# Patient Record
Sex: Male | Born: 2000
Health system: Southern US, Community
[De-identification: ages and names within clinical notes are randomized; demographics above are authoritative.]

## PROBLEM LIST (undated history)

## (undated) DIAGNOSIS — T8859XA Other complications of anesthesia, initial encounter: Secondary | ICD-10-CM

## (undated) DIAGNOSIS — D4989 Neoplasm of unspecified behavior of other specified sites: Secondary | ICD-10-CM

## (undated) DIAGNOSIS — Z8709 Personal history of other diseases of the respiratory system: Secondary | ICD-10-CM

## (undated) DIAGNOSIS — T4145XA Adverse effect of unspecified anesthetic, initial encounter: Secondary | ICD-10-CM

## (undated) DIAGNOSIS — M2520 Flail joint, unspecified joint: Secondary | ICD-10-CM

## (undated) DIAGNOSIS — M6289 Other specified disorders of muscle: Secondary | ICD-10-CM

## (undated) DIAGNOSIS — R29898 Other symptoms and signs involving the musculoskeletal system: Secondary | ICD-10-CM

## (undated) DIAGNOSIS — E559 Vitamin D deficiency, unspecified: Secondary | ICD-10-CM

## (undated) DIAGNOSIS — R203 Hyperesthesia: Secondary | ICD-10-CM

## (undated) DIAGNOSIS — K900 Celiac disease: Secondary | ICD-10-CM

## (undated) DIAGNOSIS — E039 Hypothyroidism, unspecified: Secondary | ICD-10-CM

## (undated) DIAGNOSIS — R21 Rash and other nonspecific skin eruption: Secondary | ICD-10-CM

## (undated) DIAGNOSIS — Z8489 Family history of other specified conditions: Secondary | ICD-10-CM

## (undated) DIAGNOSIS — Q909 Down syndrome, unspecified: Secondary | ICD-10-CM

## (undated) DIAGNOSIS — G4730 Sleep apnea, unspecified: Secondary | ICD-10-CM

## (undated) DIAGNOSIS — K011 Impacted teeth: Secondary | ICD-10-CM

## (undated) DIAGNOSIS — J302 Other seasonal allergic rhinitis: Secondary | ICD-10-CM

## (undated) HISTORY — PX: MASTOIDECTOMY: SHX711

## (undated) HISTORY — PX: TYMPANOSTOMY TUBE CHANGE W/ MLB: SHX2587

## (undated) HISTORY — PX: STRABISMUS SURGERY: SHX218

---
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
## 2019-01-04 DIAGNOSIS — Z713 Dietary counseling and surveillance: Secondary | ICD-10-CM | POA: Diagnosis not present

## 2019-01-04 DIAGNOSIS — Z23 Encounter for immunization: Secondary | ICD-10-CM | POA: Diagnosis not present

## 2019-01-04 DIAGNOSIS — Z6832 Body mass index (BMI) 32.0-32.9, adult: Secondary | ICD-10-CM | POA: Diagnosis not present

## 2019-01-04 DIAGNOSIS — Z1331 Encounter for screening for depression: Secondary | ICD-10-CM | POA: Diagnosis not present

## 2019-01-04 DIAGNOSIS — Q909 Down syndrome, unspecified: Secondary | ICD-10-CM | POA: Diagnosis not present

## 2019-01-04 DIAGNOSIS — Z0001 Encounter for general adult medical examination with abnormal findings: Secondary | ICD-10-CM | POA: Diagnosis not present

## 2019-01-08 DIAGNOSIS — K900 Celiac disease: Secondary | ICD-10-CM | POA: Diagnosis not present

## 2019-01-09 DIAGNOSIS — H6121 Impacted cerumen, right ear: Secondary | ICD-10-CM | POA: Insufficient documentation

## 2019-01-09 DIAGNOSIS — H938X2 Other specified disorders of left ear: Secondary | ICD-10-CM | POA: Diagnosis not present

## 2019-01-09 DIAGNOSIS — H6983 Other specified disorders of Eustachian tube, bilateral: Secondary | ICD-10-CM | POA: Diagnosis not present

## 2019-01-09 DIAGNOSIS — J343 Hypertrophy of nasal turbinates: Secondary | ICD-10-CM | POA: Diagnosis not present

## 2019-01-09 DIAGNOSIS — Z9622 Myringotomy tube(s) status: Secondary | ICD-10-CM | POA: Insufficient documentation

## 2019-01-10 DIAGNOSIS — H52203 Unspecified astigmatism, bilateral: Secondary | ICD-10-CM | POA: Diagnosis not present

## 2019-01-10 DIAGNOSIS — E039 Hypothyroidism, unspecified: Secondary | ICD-10-CM | POA: Diagnosis not present

## 2019-01-10 DIAGNOSIS — H5021 Vertical strabismus, right eye: Secondary | ICD-10-CM | POA: Diagnosis not present

## 2019-01-10 DIAGNOSIS — Z79899 Other long term (current) drug therapy: Secondary | ICD-10-CM | POA: Diagnosis not present

## 2019-01-10 DIAGNOSIS — K900 Celiac disease: Secondary | ICD-10-CM | POA: Diagnosis not present

## 2019-01-10 DIAGNOSIS — H5203 Hypermetropia, bilateral: Secondary | ICD-10-CM | POA: Diagnosis not present

## 2019-01-10 DIAGNOSIS — R7303 Prediabetes: Secondary | ICD-10-CM | POA: Diagnosis not present

## 2019-01-10 DIAGNOSIS — H5501 Congenital nystagmus: Secondary | ICD-10-CM | POA: Diagnosis not present

## 2019-01-10 DIAGNOSIS — H01003 Unspecified blepharitis right eye, unspecified eyelid: Secondary | ICD-10-CM | POA: Diagnosis not present

## 2019-01-10 DIAGNOSIS — H01006 Unspecified blepharitis left eye, unspecified eyelid: Secondary | ICD-10-CM | POA: Diagnosis not present

## 2019-01-10 DIAGNOSIS — Q909 Down syndrome, unspecified: Secondary | ICD-10-CM | POA: Diagnosis not present

## 2019-01-29 ENCOUNTER — Ambulatory Visit: Payer: BC Managed Care – PPO | Attending: Internal Medicine

## 2019-01-29 DIAGNOSIS — R238 Other skin changes: Secondary | ICD-10-CM | POA: Diagnosis not present

## 2019-01-29 DIAGNOSIS — U071 COVID-19: Secondary | ICD-10-CM

## 2019-01-31 LAB — NOVEL CORONAVIRUS, NAA: SARS-CoV-2, NAA: NOT DETECTED

## 2019-03-24 ENCOUNTER — Ambulatory Visit: Payer: BC Managed Care – PPO | Attending: Internal Medicine

## 2019-03-24 DIAGNOSIS — Z23 Encounter for immunization: Secondary | ICD-10-CM | POA: Insufficient documentation

## 2019-03-24 NOTE — Progress Notes (Signed)
   Covid-19 Vaccination Clinic  Name:  Rodney Johnston    MRN: [id number] DOB: 04-01-2000  03/24/2019  [name] was observed post Covid-19 immunization for 15 minutes without incidence. He was provided with Vaccine Information Sheet and instruction to access the V-Safe system.   [name] was instructed to call 911 with any severe reactions post vaccine: Marland Kitchen Difficulty breathing  . Swelling of your face and throat  . A fast heartbeat  . A bad rash all over your body  . Dizziness and weakness    Immunizations Administered    Name Date Dose VIS Date Route   Pfizer COVID-19 Vaccine 03/24/2019  8:13 AM 0.3 mL 01/12/2019 Intramuscular   Manufacturer: [place]   Lot: [id number]   [place]: [id number]

## 2019-04-16 ENCOUNTER — Ambulatory Visit: Payer: BC Managed Care – PPO | Attending: Internal Medicine

## 2019-04-16 DIAGNOSIS — Z23 Encounter for immunization: Secondary | ICD-10-CM

## 2019-04-16 NOTE — Progress Notes (Signed)
   Covid-19 Vaccination Clinic  Name:  Rodney Johnston    MRN: [id number] DOB: 08/16/2000  04/16/2019  [name] was observed post Covid-19 immunization for 15 minutes without incident. He was provided with Vaccine Information Sheet and instruction to access the V-Safe system.   [name] was instructed to call 911 with any severe reactions post vaccine: Marland Kitchen Difficulty breathing  . Swelling of face and throat  . A fast heartbeat  . A bad rash all over body  . Dizziness and weakness   Immunizations Administered    Name Date Dose VIS Date Route   Pfizer COVID-19 Vaccine 04/16/2019  4:40 PM 0.3 mL 01/12/2019 Intramuscular   Manufacturer: [place]   Lot: [id number]   [place]: [id number]

## 2019-07-12 DIAGNOSIS — E559 Vitamin D deficiency, unspecified: Secondary | ICD-10-CM | POA: Diagnosis not present

## 2019-07-12 DIAGNOSIS — K900 Celiac disease: Secondary | ICD-10-CM | POA: Diagnosis not present

## 2019-07-12 DIAGNOSIS — R7303 Prediabetes: Secondary | ICD-10-CM | POA: Diagnosis not present

## 2019-07-12 DIAGNOSIS — E039 Hypothyroidism, unspecified: Secondary | ICD-10-CM | POA: Diagnosis not present

## 2019-07-12 DIAGNOSIS — G4733 Obstructive sleep apnea (adult) (pediatric): Secondary | ICD-10-CM | POA: Diagnosis not present

## 2019-08-22 DIAGNOSIS — Q909 Down syndrome, unspecified: Secondary | ICD-10-CM | POA: Diagnosis not present

## 2019-08-22 DIAGNOSIS — H6642 Suppurative otitis media, unspecified, left ear: Secondary | ICD-10-CM | POA: Diagnosis not present

## 2019-08-22 DIAGNOSIS — L738 Other specified follicular disorders: Secondary | ICD-10-CM | POA: Diagnosis not present

## 2019-08-22 DIAGNOSIS — L304 Erythema intertrigo: Secondary | ICD-10-CM | POA: Diagnosis not present

## 2019-08-28 DIAGNOSIS — Z9622 Myringotomy tube(s) status: Secondary | ICD-10-CM | POA: Diagnosis not present

## 2019-08-28 DIAGNOSIS — H6983 Other specified disorders of Eustachian tube, bilateral: Secondary | ICD-10-CM | POA: Diagnosis not present

## 2019-08-28 DIAGNOSIS — Z8669 Personal history of other diseases of the nervous system and sense organs: Secondary | ICD-10-CM | POA: Diagnosis not present

## 2019-09-12 DIAGNOSIS — Z20822 Contact with and (suspected) exposure to covid-19: Secondary | ICD-10-CM | POA: Diagnosis not present

## 2019-11-13 DIAGNOSIS — Z9622 Myringotomy tube(s) status: Secondary | ICD-10-CM | POA: Diagnosis not present

## 2019-11-13 DIAGNOSIS — H6983 Other specified disorders of Eustachian tube, bilateral: Secondary | ICD-10-CM | POA: Diagnosis not present

## 2020-01-16 DIAGNOSIS — Q909 Down syndrome, unspecified: Secondary | ICD-10-CM | POA: Diagnosis not present

## 2020-01-16 DIAGNOSIS — E039 Hypothyroidism, unspecified: Secondary | ICD-10-CM | POA: Diagnosis not present

## 2020-01-16 DIAGNOSIS — K900 Celiac disease: Secondary | ICD-10-CM | POA: Diagnosis not present

## 2020-01-16 DIAGNOSIS — E559 Vitamin D deficiency, unspecified: Secondary | ICD-10-CM | POA: Diagnosis not present

## 2020-01-16 DIAGNOSIS — R7303 Prediabetes: Secondary | ICD-10-CM | POA: Diagnosis not present

## 2020-01-16 DIAGNOSIS — H5501 Congenital nystagmus: Secondary | ICD-10-CM | POA: Diagnosis not present

## 2020-01-16 DIAGNOSIS — H5021 Vertical strabismus, right eye: Secondary | ICD-10-CM | POA: Diagnosis not present

## 2020-02-06 DIAGNOSIS — Z20822 Contact with and (suspected) exposure to covid-19: Secondary | ICD-10-CM | POA: Diagnosis not present

## 2020-04-24 DIAGNOSIS — Q909 Down syndrome, unspecified: Secondary | ICD-10-CM | POA: Diagnosis not present

## 2020-04-24 DIAGNOSIS — L304 Erythema intertrigo: Secondary | ICD-10-CM | POA: Diagnosis not present

## 2020-04-24 DIAGNOSIS — R7303 Prediabetes: Secondary | ICD-10-CM | POA: Diagnosis not present

## 2020-04-24 DIAGNOSIS — Z713 Dietary counseling and surveillance: Secondary | ICD-10-CM | POA: Diagnosis not present

## 2020-04-24 DIAGNOSIS — Z0001 Encounter for general adult medical examination with abnormal findings: Secondary | ICD-10-CM | POA: Diagnosis not present

## 2020-04-24 DIAGNOSIS — L739 Follicular disorder, unspecified: Secondary | ICD-10-CM | POA: Diagnosis not present

## 2020-04-24 DIAGNOSIS — E039 Hypothyroidism, unspecified: Secondary | ICD-10-CM | POA: Diagnosis not present

## 2020-04-24 DIAGNOSIS — K900 Celiac disease: Secondary | ICD-10-CM | POA: Diagnosis not present

## 2020-04-24 DIAGNOSIS — L219 Seborrheic dermatitis, unspecified: Secondary | ICD-10-CM | POA: Diagnosis not present

## 2020-05-14 ENCOUNTER — Telehealth: Payer: Self-pay | Admitting: Pediatrics

## 2020-05-14 DIAGNOSIS — H5501 Congenital nystagmus: Secondary | ICD-10-CM | POA: Insufficient documentation

## 2020-05-14 NOTE — Telephone Encounter (Signed)
Patients mother called and said she spoke with you a while ago about her son becoming a new pt. Her name is Human resources officer. Please advise

## 2020-05-14 NOTE — Telephone Encounter (Signed)
Yes, I agreed to take him.

## 2020-06-02 ENCOUNTER — Institutional Professional Consult (permissible substitution): Payer: BC Managed Care – PPO | Admitting: Neurology

## 2020-06-03 ENCOUNTER — Ambulatory Visit (INDEPENDENT_AMBULATORY_CARE_PROVIDER_SITE_OTHER): Payer: BC Managed Care – PPO | Admitting: Neurology

## 2020-06-03 ENCOUNTER — Other Ambulatory Visit: Payer: Self-pay

## 2020-06-03 ENCOUNTER — Encounter: Payer: Self-pay | Admitting: Neurology

## 2020-06-03 VITALS — BP 119/75 | HR 75 | Ht 59.5 in | Wt 182.0 lb

## 2020-06-03 DIAGNOSIS — R0681 Apnea, not elsewhere classified: Secondary | ICD-10-CM

## 2020-06-03 DIAGNOSIS — R0683 Snoring: Secondary | ICD-10-CM

## 2020-06-03 DIAGNOSIS — R351 Nocturia: Secondary | ICD-10-CM

## 2020-06-03 DIAGNOSIS — R519 Headache, unspecified: Secondary | ICD-10-CM

## 2020-06-03 DIAGNOSIS — R635 Abnormal weight gain: Secondary | ICD-10-CM

## 2020-06-03 DIAGNOSIS — G4719 Other hypersomnia: Secondary | ICD-10-CM

## 2020-06-03 DIAGNOSIS — E669 Obesity, unspecified: Secondary | ICD-10-CM | POA: Diagnosis not present

## 2020-06-03 DIAGNOSIS — Q909 Down syndrome, unspecified: Secondary | ICD-10-CM

## 2020-06-03 DIAGNOSIS — Z820 Family history of epilepsy and other diseases of the nervous system: Secondary | ICD-10-CM

## 2020-06-03 NOTE — Progress Notes (Signed)
Subjective:    Patient ID: Rodney Johnston is a 20 y.o. male.  HPI     Star Age, MD, PhD [facility] [address] [place], [place] [address]  Dear Dr. Corinna Lines,   I saw your patient, Rodney Johnston, upon your kind request, in my sleep clinic today for initial consultation of his sleep disorder, in particular, concern for underlying obstructive sleep apnea.  The patient is accompanied by his mother today.  As you know, Mr. Fessel is a 20 year old right-handed gentleman with an underlying history of celiac disease, asthma, allergies, hypothyroidism, vitamin D deficiency, Down syndrome, prediabetes and obesity, who reports feeling sleepy during the day.  He is currently home from school.  He is at [employer].  He reports going to bed around 10 or 11 and rise time is generally between 6 and 7.  He drinks caffeine in the form of soda, generally 1/day on average. His mother provides details of his history.  She reports that patient had his tonsils and adenoids out when he was about 20 years old.  At [age] and possibly at age 65 he had sleep studies at [facility].  He was not diagnosed with sleep apnea, certainly not enough to be on a CPAP machine.  Paternal grandparents have sleep apnea in both of machines.  Patient's father recently had a diagnosis of sleep apnea but not enough to be on a CPAP machine per mom.  She has noticed that his snoring has become worse and his sleepiness has also become worse, teachers have commented on his sleepiness.  He has gained weight in the realm of 30 pounds in the past 2 years.  He is in the process of working on weight loss. I reviewed your office note from 04/24/2020.  His Epworth sleepiness score is 16 out of 24.  He has complained of headaches in the night.  He reports getting up to go to the bathroom once per average night.  His Past Medical History Is Significant For: Past Medical History:  Diagnosis Date   . Anesthesia complication    mother states difficulty maintaining airway under conscious sedation previously; was told to never undergo a procedure without artificial airway maintenance  . Celiac disease   . Down syndrome    c-spine films done 09/13/2012: no atlantoaxial instability  . History of asthma    no treatment since 2010  . Hypothyroid   . Impacted teeth with abnormal position 10/2013   wisdom teeth  . Loose joints    due to Down syndrome  . Low muscle tone   . Rash, skin 10/11/2013   multiple sites  . Seasonal allergies   . Sensitive skin   . Sleep apnea    has had multiple sleep studies; mother states is in normal range, no CPAP use  . 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 Vitamin D deficiency     His Past Surgical History Is Significant For: Past Surgical History:  Procedure Laterality Date  . DUODENAL ATRESIA REPAIR  18-Jun-1999  . EXPLORATORY LAPAROTOMY  1999-11-02  . HEMANGIOMA EXCISION  2012   from eye  . MASTOIDECTOMY    . MULTIPLE TOOTH EXTRACTIONS  10/02/2009   #R,Q,N,M  . STRABISMUS SURGERY  2010, 2011  . TEAR DUCT PROBING  2002  . TONSILLECTOMY AND ADENOIDECTOMY  02/23/2001  . TOOTH EXTRACTION  09/23/2011   Procedure: EXTRACTION MOLARS;  Surgeon: Ceasar Mons, DDS;  Location:  [place];  Service: Oral Surgery;  Laterality: N/A;  surgical removal of teeth A,B,C,H,J,K,T  . TOOTH EXTRACTION N/A 10/17/2013   Procedure: EXTRACTION TEETH #17, 21, 28, 32, REMOVAL OF ODONTOGENIC TUMOR LEFT MANDIBLE, EXPOSE TOOTH #15 AND LABIAL FRENECTOMY    ;  Surgeon: Ceasar Mons, DDS;  Location: [place];  Service: Oral Surgery;  Laterality: N/A;  . TYMPANOSTOMY TUBE CHANGE W/ MLB  10/02/2009; 02/23/2001   revision right ear myringotomy; remove and replace left ear tube  . TYMPANOSTOMY TUBE PLACEMENT  04/18/2000  . UPPER GI ENDOSCOPY  2007    His Family History Is Significant For: Family History  Problem Relation Age of Onset  . Diabetes  Maternal Grandmother   . Hypertension Maternal Grandmother   . Asthma Paternal Grandfather     His Social History Is Significant For: Social History   Socioeconomic History  . Marital status: Single    Spouse name: Not on file  . Number of children: Not on file  . Years of education: Not on file  . Highest education level: Not on file  Occupational History  . Not on file  Tobacco Use  . Smoking status: Never Smoker  . Smokeless tobacco: Never Used  Substance and Sexual Activity  . Alcohol use: No  . Drug use: No  . Sexual activity: Not on file  Other Topics Concern  . Not on file  Social History Narrative  . Not on file   Social Determinants of Health   Financial Resource Strain: Not on file  Food Insecurity: Not on file  Transportation Needs: Not on file  Physical Activity: Not on file  Stress: Not on file  Social Connections: Not on file    His Allergies Are:  Allergies  Allergen Reactions  . Wheat Bran Other (See Comments)    CELIAC DISEASE  :   His Current Medications Are:  Outpatient Encounter Medications as of 06/03/2020  Medication Sig  . levothyroxine (SYNTHROID, LEVOTHROID) 25 MCG tablet Take 10 mcg by mouth daily.  . polyethylene glycol (MIRALAX / GLYCOLAX) packet Take 17 g by mouth daily.  . [DISCONTINUED] cholecalciferol (VITAMIN D) 1000 UNITS tablet Take 1,000 Units by mouth daily.  . [DISCONTINUED] HYDROcodone-acetaminophen (NORCO) 5-325 MG per tablet Take 1 tablet by mouth every 6 (six) hours as needed for moderate pain.  . [DISCONTINUED] mupirocin cream (BACTROBAN) 2 % Apply 1 application topically 2 (two) times daily.   No facility-administered encounter medications on file as of 06/03/2020.  :   Review of Systems:  Out of a complete 14 point review of systems, all are reviewed and negative with the exception of these symptoms as listed below: Review of Systems  Neurological:       Here for sleep consult. Mom reports 2 sleep studies in the  past one at age 60 and the other at age 41. sts Mild OSA was seen. Here to reassess . Epworth Sleepiness Scale 0= would never doze 1= slight chance of dozing 2= moderate chance of dozing 3= high chance of dozing  Sitting and reading:3 Watching TV:2 Sitting inactive in a public place (ex. Theater or meeting):3 As a passenger in a car for an hour without a break:3 Lying down to rest in the afternoon:3 Sitting and talking to someone:1 Sitting quietly after lunch (no alcohol):1 In a car, while stopped in traffic:0 Total:16     Objective:  Neurological Exam  Physical Exam Physical Examination:   Vitals:   06/03/20  1509  BP: 119/75  Pulse: 75    General Examination: The patient is a very pleasant 20 y.o. male in no acute distress. He appears well-developed and well-nourished and well groomed.   HEENT: Normocephalic, atraumatic, pupils are equal, nystagmus noted in all gaze directions.  Face is symmetric, airway examination shows small airway entry, uvula not fully visualized, tonsils absent.  Tongue protrudes centrally and palate elevates symmetrically but tip of uvula not visible.  Nasal inspection reveals small nasal passage.  No carotid bruits, range of motion normal in the neck.   Chest: Clear to auscultation without wheezing, rhonchi or crackles noted.  Heart: S1+S2+0, regular and normal without murmurs, rubs or gallops noted.   Abdomen: Soft, non-tender and non-distended with normal bowel sounds appreciated on auscultation.  Extremities: There is no pitting edema in the distal lower extremities bilaterally.   Skin: Warm and dry without trophic changes noted.   Musculoskeletal: exam reveals no obvious joint deformities, tenderness or joint swelling or erythema.   Neurologically:  Mental status: The patient is awake, alert and oriented in all 4 spheres. His immediate and remote memory, attention, language skills and fund of knowledge are appropriate. There is no evidence  of aphasia, agnosia, apraxia or anomia. Speech is clear with normal prosody and enunciation. Thought process is linear. Mood is normal and affect is normal.  Cranial nerves II - XII are as described above under HEENT exam.  Motor exam: Normal bulk, strength and tone is noted. There is no tremor, Romberg is negative. Fine motor skills and coordination: grossly intact.  Cerebellar testing: No dysmetria or intention tremor. There is no truncal or gait ataxia.  Sensory exam: intact to light touch in the upper and lower extremities.  Gait, station and balance: He stands easily. No veering to one side is noted.  Posture is normal but stands laterally wider base.  He walks without difficulty.    Assessment and Plan:   In summary, GRANITE DOWSETT is a very pleasant 20 y.o.-year old male with an underlying history of celiac disease, asthma, allergies, hypothyroidism, vitamin D deficiency, Down syndrome, prediabetes and obesity, whose history and physical exam are concerning for obstructive sleep apnea (OSA). I had a long chat with the patient and his mother about my findings and the diagnosis of OSA, its prognosis and treatment options. We talked about positive airway pressure treatment.  He would be willing to try it, his roommate apparently has a CPAP machine or something similar.  I explained in particular the risks and ramifications of untreated moderate to severe OSA, especially with respect to developing cardiovascular disease down the Road, including congestive heart failure, difficult to treat hypertension, cardiac arrhythmias, or stroke. Even type 2 diabetes has, in part, been linked to untreated OSA. Symptoms of untreated OSA include daytime sleepiness, memory problems, mood irritability and mood disorder such as depression and anxiety, lack of energy, as well as recurrent headaches, especially morning headaches. We talked about trying to maintain a healthy lifestyle in general, as well as the  importance of weight control. We also talked about the importance of good sleep hygiene. I recommended the following at this time: sleep study.  Mom reports that they will probably prefer testing at home. I explained the sleep test procedure to the patient and also outlined possible surgical and non-surgical treatment options of OSA. I also explained the CPAP treatment option to the patient, who indicated that he would be willing to try CPAP if the need arises. I  explained the importance of being compliant with PAP treatment, not only for insurance purposes but primarily to improve His symptoms, and for the patient's long term health benefit, including to reduce His cardiovascular risks. I answered all their questions today and the patient and his mother were in agreement. I plan to see him back after the sleep study is completed and encouraged them to call with any interim questions, concerns, problems or updates.   Thank you very much for allowing me to participate in the care of this nice patient. If I can be of any further assistance to you please do not hesitate to call me at [phone].  Sincerely,   Star Age, MD, PhD

## 2020-06-03 NOTE — Patient Instructions (Signed)

## 2020-06-05 DIAGNOSIS — Z9622 Myringotomy tube(s) status: Secondary | ICD-10-CM | POA: Diagnosis not present

## 2020-06-05 DIAGNOSIS — H6983 Other specified disorders of Eustachian tube, bilateral: Secondary | ICD-10-CM | POA: Diagnosis not present

## 2020-06-05 DIAGNOSIS — H938X3 Other specified disorders of ear, bilateral: Secondary | ICD-10-CM | POA: Diagnosis not present

## 2020-06-05 DIAGNOSIS — H6123 Impacted cerumen, bilateral: Secondary | ICD-10-CM | POA: Insufficient documentation

## 2020-06-05 DIAGNOSIS — H61893 Other specified disorders of external ear, bilateral: Secondary | ICD-10-CM | POA: Diagnosis not present

## 2020-06-09 ENCOUNTER — Telehealth: Payer: Self-pay

## 2020-06-09 NOTE — Telephone Encounter (Signed)
Returned callback from mother and unable to reach; LVM for pt to call me back to schedule sleep study

## 2020-06-15 NOTE — Patient Instructions (Addendum)
It was nice to meet you.     Medications changes include :   none      Please followup in 1 years

## 2020-06-15 NOTE — Progress Notes (Signed)
Subjective:    Patient ID: Rodney Johnston, male    DOB: [birth year], 20 y.o.   MRN: [id number]  HPI He is here to establish with a new primary care physician.  The patient is here for follow up of his chronic medical problems, including hypothyroidism, prediabetes, sleep apnea, Down syndrome and celiac disease similes.  He follows with endocrine, ENT/augdiology, neurology - [facility], derm, [facility].    He is working is on weight loss.  Marland KitchenHe rides a bike, swimming sometimes.  He is trying to eat healthier this summer since he is not at school.        Medications and allergies reviewed with patient and updated if appropriate.  Patient Active Problem List   Diagnosis Date Noted  . Allergic rhinitis 06/16/2020  . 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 Pre-diabetes 07/25/2012  . Vitamin D deficiency 12/13/2011  . Down syndrome 05/04/2011  . Celiac disease 05/04/2011  . Sleep apnea 05/04/2011  . Hypothyroidism (acquired) 05/04/2011    Current Outpatient Medications on File Prior to Visit  Medication Sig Dispense Refill  . cetirizine (ZYRTEC) 10 MG tablet Take by mouth.    . fluticasone (FLONASE) 50 MCG/ACT nasal spray Place into the nose.    Marland Kitchen ketoconazole (NIZORAL) 2 % shampoo Apply topically 3 (three) times a week.    . levothyroxine (SYNTHROID, LEVOTHROID) 25 MCG tablet Take 10 mcg by mouth daily.    . polyethylene glycol (MIRALAX / GLYCOLAX) packet Take 17 g by mouth daily.     No current facility-administered medications on file prior to visit.    Past Medical History:  Diagnosis Date  . Anesthesia complication    mother states difficulty maintaining airway under conscious sedation previously; was told to never undergo a procedure without artificial airway maintenance  . Celiac disease   . Down syndrome    c-spine films done 09/13/2012: no atlantoaxial instability  . History of asthma    no treatment since 2010  . Hypothyroid   . Impacted teeth with  abnormal position 10/2013   wisdom teeth  . Loose joints    due to Down syndrome  . Low muscle tone   . Rash, skin 10/11/2013   multiple sites  . Seasonal allergies   . Sensitive skin   . Sleep apnea    has had multiple sleep studies; mother states is in normal range, no CPAP use  . 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 Vitamin D deficiency     Past Surgical History:  Procedure Laterality Date  . DUODENAL ATRESIA REPAIR  17-Sep-1999  . EXPLORATORY LAPAROTOMY  01-May-1999  . HEMANGIOMA EXCISION  2012   from eye  . MASTOIDECTOMY    . MULTIPLE TOOTH EXTRACTIONS  10/02/2009   #R,Q,N,M  . STRABISMUS SURGERY  2010, 2011  . TEAR DUCT PROBING  2002  . TONSILLECTOMY AND ADENOIDECTOMY  02/23/2001  . TOOTH EXTRACTION  09/23/2011   Procedure: EXTRACTION MOLARS;  Surgeon: Ceasar Mons, DDS;  Location: [place];  Service: Oral Surgery;  Laterality: N/A;  surgical removal of teeth A,B,C,H,J,K,T  . TOOTH EXTRACTION N/A 10/17/2013   Procedure: EXTRACTION TEETH #17, 21, 28, 32, REMOVAL OF ODONTOGENIC TUMOR LEFT MANDIBLE, EXPOSE TOOTH #15 AND LABIAL FRENECTOMY    ;  Surgeon: Ceasar Mons, DDS;  Location: [place];  Service: Oral Surgery;  Laterality: N/A;  . TYMPANOSTOMY TUBE CHANGE W/ MLB  10/02/2009; 02/23/2001   revision right  ear myringotomy; remove and replace left ear tube  . TYMPANOSTOMY TUBE PLACEMENT  04/18/2000  . UPPER GI ENDOSCOPY  2007    Social History   Socioeconomic History  . Marital status: Single    Spouse name: Not on file  . Number of children: Not on file  . Years of education: Not on file  . Highest education level: Not on file  Occupational History  . Not on file  Tobacco Use  . Smoking status: Never Smoker  . Smokeless tobacco: Never Used  Substance and Sexual Activity  . Alcohol use: No  . Drug use: No  . Sexual activity: Not on file  Other Topics Concern  . Not on file  Social History Narrative  . Not on file   Social  Determinants of Health   Financial Resource Strain: Not on file  Food Insecurity: Not on file  Transportation Needs: Not on file  Physical Activity: Not on file  Stress: Not on file  Social Connections: Not on file    Family History  Problem Relation Age of Onset  . Diabetes Maternal Grandmother   . Hypertension Maternal Grandmother   . Asthma Paternal Grandfather     Review of Systems  Constitutional: Negative for chills and fever.  Respiratory: Negative for cough, shortness of breath and wheezing.   Cardiovascular: Negative for chest pain, palpitations and leg swelling.  Gastrointestinal: Positive for constipation. Negative for abdominal pain and nausea.  Musculoskeletal: Positive for back pain. Negative for arthralgias.  Neurological: Positive for headaches (nighttime). Negative for dizziness and light-headedness.  Psychiatric/Behavioral: Negative for dysphoric mood. The patient is not nervous/anxious.        Objective:   Vitals:   06/16/20 1517  BP: 112/72  Pulse: 64  Temp: 97.9 F (36.6 C)  SpO2: 95%   BP Readings from Last 3 Encounters:  06/16/20 112/72  06/03/20 119/75  10/17/13 100/73 (36 %, Z = -0.36 /  89 %, Z = 1.23)*   *BP percentiles are based on the 2017 AAP Clinical Practice Guideline for boys   Wt Readings from Last 3 Encounters:  06/16/20 178 lb (80.7 kg)  06/03/20 182 lb (82.6 kg)  10/17/13 156 lb (70.8 kg) (91 %, Z= 1.35)*   * Growth percentiles are based on CDC (Boys, 2-20 Years) data.   Body mass index is 35.35 kg/m.   Physical Exam    Constitutional: Appears well-developed and well-nourished. No distress.  HENT:  Head: Normocephalic and atraumatic.  Neck: Neck supple. No tracheal deviation present. No thyromegaly present.  No cervical lymphadenopathy Cardiovascular: Normal rate, regular rhythm and normal heart sounds.   No murmur heard. No carotid bruit .  No edema Pulmonary/Chest: Effort normal and breath sounds normal. No  respiratory distress. No has no wheezes. No rales.  Abdomen: Soft, nontender, nondistended Skin: Skin is warm and dry. Not diaphoretic.  Psychiatric: Normal mood and affect. Behavior is normal.      Assessment & Plan:    See Problem List for Assessment and Plan of chronic medical problems.    This visit occurred during the SARS-CoV-2 public health emergency.  Safety protocols were in place, including screening questions prior to the visit, additional usage of staff PPE, and extensive cleaning of exam room while observing appropriate contact time as indicated for disinfecting solutions.

## 2020-06-16 ENCOUNTER — Encounter: Payer: Self-pay | Admitting: Internal Medicine

## 2020-06-16 ENCOUNTER — Other Ambulatory Visit: Payer: Self-pay

## 2020-06-16 ENCOUNTER — Ambulatory Visit (INDEPENDENT_AMBULATORY_CARE_PROVIDER_SITE_OTHER): Payer: BC Managed Care – PPO | Admitting: Internal Medicine

## 2020-06-16 VITALS — BP 112/72 | HR 64 | Temp 97.9°F | Ht 59.5 in | Wt 178.0 lb

## 2020-06-16 DIAGNOSIS — H5501 Congenital nystagmus: Secondary | ICD-10-CM

## 2020-06-16 DIAGNOSIS — R7303 Prediabetes: Secondary | ICD-10-CM | POA: Diagnosis not present

## 2020-06-16 DIAGNOSIS — E559 Vitamin D deficiency, unspecified: Secondary | ICD-10-CM

## 2020-06-16 DIAGNOSIS — E039 Hypothyroidism, unspecified: Secondary | ICD-10-CM

## 2020-06-16 DIAGNOSIS — J302 Other seasonal allergic rhinitis: Secondary | ICD-10-CM

## 2020-06-16 DIAGNOSIS — Q909 Down syndrome, unspecified: Secondary | ICD-10-CM

## 2020-06-16 DIAGNOSIS — G4730 Sleep apnea, unspecified: Secondary | ICD-10-CM | POA: Diagnosis not present

## 2020-06-16 DIAGNOSIS — K900 Celiac disease: Secondary | ICD-10-CM

## 2020-06-16 DIAGNOSIS — J309 Allergic rhinitis, unspecified: Secondary | ICD-10-CM | POA: Insufficient documentation

## 2020-06-16 NOTE — Assessment & Plan Note (Signed)
Has not been officially dx - but symptoms c/w OSA Tests in past non conclusive Working on weight loss Will have home test soon

## 2020-06-16 NOTE — Assessment & Plan Note (Signed)
Chronic Monitored by endocrine

## 2020-06-16 NOTE — Assessment & Plan Note (Signed)
Chronic Following at [facility]

## 2020-06-16 NOTE — Assessment & Plan Note (Signed)
Chronic Management per endocrine 

## 2020-06-16 NOTE — Assessment & Plan Note (Signed)
Chronic Following with endocrine 

## 2020-06-16 NOTE — Assessment & Plan Note (Signed)
Chronic Taking zyrtec and flonase

## 2020-06-17 ENCOUNTER — Encounter: Payer: Self-pay | Admitting: Internal Medicine

## 2020-06-25 ENCOUNTER — Ambulatory Visit (INDEPENDENT_AMBULATORY_CARE_PROVIDER_SITE_OTHER): Payer: BC Managed Care – PPO | Admitting: Neurology

## 2020-06-25 DIAGNOSIS — E669 Obesity, unspecified: Secondary | ICD-10-CM

## 2020-06-25 DIAGNOSIS — R0681 Apnea, not elsewhere classified: Secondary | ICD-10-CM

## 2020-06-25 DIAGNOSIS — R635 Abnormal weight gain: Secondary | ICD-10-CM

## 2020-06-25 DIAGNOSIS — R519 Headache, unspecified: Secondary | ICD-10-CM

## 2020-06-25 DIAGNOSIS — G4733 Obstructive sleep apnea (adult) (pediatric): Secondary | ICD-10-CM

## 2020-06-25 DIAGNOSIS — Z820 Family history of epilepsy and other diseases of the nervous system: Secondary | ICD-10-CM

## 2020-06-25 DIAGNOSIS — G4719 Other hypersomnia: Secondary | ICD-10-CM

## 2020-06-25 DIAGNOSIS — Q909 Down syndrome, unspecified: Secondary | ICD-10-CM

## 2020-06-25 DIAGNOSIS — R0683 Snoring: Secondary | ICD-10-CM

## 2020-06-25 DIAGNOSIS — R351 Nocturia: Secondary | ICD-10-CM

## 2020-06-26 NOTE — Progress Notes (Signed)
See procedure note.

## 2020-06-27 NOTE — Addendum Note (Signed)
Addended by: Star Age on: 06/27/2020 01:57 PM   Modules accepted: Orders

## 2020-06-27 NOTE — Progress Notes (Signed)
Patient referred by Dr. Corinna Lines (pediatrician), seen by me on 06/03/20, patient had a HST on 06/25/20.    Please call and notify the patient and/or mother that his recent home sleep test showed obstructive sleep apnea in the severe range. I recommend treatment in the form of autoPAP, which means, that we don't have to bring him in for a sleep study with CPAP, but will let him start using a so called autoPAP machine at home, which is a CPAP-like machine with self-adjusting pressures. We will send the order to a local DME company (of his choice, or as per insurance requirement). The DME representative will fit him with a mask, educate him on how to use the machine, how to put the mask on, etc. I have placed an order in the chart. Please send the order, talk to patient, send report to referring MD. We will need a FU in sleep clinic for 10 weeks post-PAP set up, please arrange that with me or one of our NPs. Also reinforce the need for compliance with treatment. Thanks,   Star Age, MD, PhD [facility])

## 2020-06-27 NOTE — Procedures (Signed)
[facility] TEST (Watch PAT)  STUDY DATE: 06/25/20  DOB: 08/15/2000  MRN: [id number]  ORDERING CLINICIAN: Star Age, MD, PhD   REFERRING CLINICIAN: Dr. Corinna Lines   CLINICAL INFORMATION/HISTORY: 20 year old man with a history of celiac disease, asthma, allergies, hypothyroidism, vitamin D deficiency, Down syndrome, prediabetes and obesity, who reports feeling sleepy during the day.   Epworth sleepiness score: 16/24.  BMI: 36.0 kg/m  Neck Circumference: N/A  FINDINGS:   Total Record Time (hours, min): 6 H 25 min  Total Sleep Time (hours, min):  5 H 58 min   Percent REM (%):    15.12%   Calculated pAHI (per hour): 82.3     REM pAHI: 83.2  NREM pAHI: 82.2 Supine AHI: 83.9   Oxygen Saturation (%) Mean: 90  Minimum oxygen saturation (%):        71   O2 Saturation Range (%): 71-99  O2Saturation (minutes) <=88%: 87.1 min   Pulse Mean (bpm):    71  Pulse Range (43-121)   IMPRESSION: OSA (obstructive sleep apnea), severe   RECOMMENDATION:  This home sleep test demonstrates severe obstructive sleep apnea with a total AHI of 82.3/hour and O2 nadir of 71%. Treatment with positive airway pressure is recommended. The patient will be advised to proceed with an autoPAP titration/trial at home for now. A full night titration study may be considered to optimize treatment settings, if needed down the road. Please note that untreated obstructive sleep apnea may carry additional perioperative morbidity. Patients with significant obstructive sleep apnea should receive perioperative PAP therapy and the surgeons and particularly the anesthesiologist should be informed of the diagnosis and the severity of the sleep disordered breathing. The patient should be cautioned not to drive, work at heights, or operate dangerous or heavy equipment when tired or sleepy. Review and reiteration of good sleep hygiene measures should be pursued with any patient. Other causes of the  patient's symptoms, including circadian rhythm disturbances, an underlying mood disorder, medication effect and/or an underlying medical problem cannot be ruled out based on this test. Clinical correlation is recommended. The patient and his referring provider will be notified of the test results. The patient will be seen in follow up in sleep clinic at [facility].  I certify that I have reviewed the raw data recording prior to the issuance of this report in accordance with the standards of the American Academy of Sleep Medicine (AASM).  INTERPRETING PHYSICIAN:  Star Age, MD, PhD  Board Certified in Neurology and Sleep Medicine  [facility] [address] [place], [place] [address] [phone]  Sleep Summary  Oxygen Saturation Statistics   Start Study Time: End Study Time: Total Recording Time:         10:30:13 PM 4:55:24 AM 6 h, 25 min  Total Sleep Time % REM of Sleep Time:  5 h, 58 min  15.1    Mean: 90 Minimum: 71 Maximum: 99  Mean of Desaturations Nadirs (%):   85  Oxygen Desatur. %:  4-9 10-20 >20 Total  Events Number Total   181  168 46.4 43.1  41 10.5  390 100.0  Oxygen Saturation: <90 <=88 <85 <80 <70  Duration (minutes): Sleep % 122.7 34.3 87.1 28.5 24.3 8.0 11.3 3.2 0.0 0.0     Respiratory Indices      Total Events REM NREM All Night  pRDI: pAHI 3%: ODI 4%: pAHIc 3%: % CSR: pAHI 4%:  496  490 390  88 0.0 448 83.2 83.2 67.7 17.1 83.4 82.2 65.1 15.3 83.3 82.3 65.5 15.5  75.3       Pulse Rate Statistics during Sleep (BPM)      Mean: 71 Minimum: 43 Maximum: 121        Body Position Statistics  Position Supine Prone Right Left Non-Supine  Sleep (min) 242.0 54.2 41.0 20.0 115.2  Sleep % 67.6 15.1 11.4 5.6 32.2  pRDI 83.9 71.0 87.9 102.3 82.4  pAHI 3% 83.4 69.9 83.5 102.3 80.4  ODI 4% 67.2 54.3 65.9 78.3 62.6     Snoring Statistics Snoring Level (dB) >40 >50 >60 >70 >80 >Threshold (45)  Sleep (min) 137.6  15.9 6.6 0.0 0.0 44.0  Sleep % 38.4 4.4 1.8 0.0 0.0 12.3    Mean: 42 dB

## 2020-07-01 ENCOUNTER — Telehealth: Payer: Self-pay

## 2020-07-01 NOTE — Telephone Encounter (Signed)
-----   Message from Star Age, MD sent at 06/27/2020  1:57 PM EDT ----- Patient referred by Dr. Corinna Lines (pediatrician), seen by me on 06/03/20, patient had a HST on 06/25/20.    Please call and notify the patient and/or mother that his recent home sleep test showed obstructive sleep apnea in the severe range. I recommend treatment in the form of autoPAP, which means, that we don't have to bring him in for a sleep study with CPAP, but will let him start using a so called autoPAP machine at home, which is a CPAP-like machine with self-adjusting pressures. We will send the order to a local DME company (of his choice, or as per insurance requirement). The DME representative will fit him with a mask, educate him on how to use the machine, how to put the mask on, etc. I have placed an order in the chart. Please send the order, talk to patient, send report to referring MD. We will need a FU in sleep clinic for 10 weeks post-PAP set up, please arrange that with me or one of our NPs. Also reinforce the need for compliance with treatment. Thanks,   Star Age, MD, PhD [facility])

## 2020-07-01 NOTE — Telephone Encounter (Signed)
I called pt's mom, order sent to [facility]. Futher notes to follow.

## 2020-07-02 NOTE — Telephone Encounter (Signed)
Late Entry--I called pt's mom on 07/02/20. I advised that Dr. Rexene Alberts reviewed their sleep study results and found that pt Severe OSA. Dr. Rexene Alberts recommends that pt start an auto cpap. I reviewed PAP compliance expectations with the pt's mom. Pt is agreeable to starting an auto-PAP. I advised pt that an order will be sent to a DME, [facility], and [facility] will call the pt within about one week after they file with the pt's insurance. [facility] will show the pt how to use the machine, fit for masks, and troubleshoot the auto-PAP if needed. A follow up appt was made for insurance purposes with Dr. Rexene Alberts on 09/25/20 at 200. Pt verbalized understanding to arrive 15 minutes early and bring their auto-PAP. A letter with all of this information in it will be mailed to the pt as a reminder. I verified with the pt that the address we have on file is correct. Pt verbalized understanding of results. Pt had no questions at this time but was encouraged to call back if questions arise. I have sent the order to [facility] and have received confirmation that they have received the order.

## 2020-07-10 DIAGNOSIS — G4733 Obstructive sleep apnea (adult) (pediatric): Secondary | ICD-10-CM | POA: Diagnosis not present

## 2020-07-16 ENCOUNTER — Encounter: Payer: Self-pay | Admitting: Internal Medicine

## 2020-07-21 ENCOUNTER — Ambulatory Visit (INDEPENDENT_AMBULATORY_CARE_PROVIDER_SITE_OTHER): Payer: BC Managed Care – PPO | Admitting: Internal Medicine

## 2020-07-21 ENCOUNTER — Encounter: Payer: Self-pay | Admitting: Internal Medicine

## 2020-07-21 ENCOUNTER — Other Ambulatory Visit: Payer: Self-pay

## 2020-07-21 DIAGNOSIS — M25532 Pain in left wrist: Secondary | ICD-10-CM | POA: Diagnosis not present

## 2020-07-21 NOTE — Patient Instructions (Signed)
We will have you use the voltaren gel on the wrist to help with the pain.  If this does not help let us know.

## 2020-07-21 NOTE — Progress Notes (Signed)
   Subjective:   Patient ID: Rodney Johnston, male    DOB: Apr 03, 2000, 20 y.o.   MRN: [id number]  Wrist Pain   The patient is a 20 YO man coming in for left wrist pain and problems. Started recently he is riding bicycle more up to 9 miles per day. Having pain in the wrist area. Not routinely swelling or red although that happened once. No injury or fall. Mom present to help with history.   Review of Systems  Constitutional: Negative.   HENT: Negative.    Eyes: Negative.   Respiratory:  Negative for cough, chest tightness and shortness of breath.   Cardiovascular:  Negative for chest pain, palpitations and leg swelling.  Gastrointestinal:  Negative for abdominal distention, abdominal pain, constipation, diarrhea, nausea and vomiting.  Musculoskeletal:  Positive for myalgias.  Skin: Negative.   Neurological: Negative.   Psychiatric/Behavioral: Negative.     Objective:  Physical Exam Constitutional:      Appearance: He is well-developed.  HENT:     Head: Normocephalic and atraumatic.  Cardiovascular:     Rate and Rhythm: Normal rate and regular rhythm.  Pulmonary:     Effort: Pulmonary effort is normal. No respiratory distress.     Breath sounds: Normal breath sounds. No wheezing or rales.  Abdominal:     General: Bowel sounds are normal. There is no distension.     Palpations: Abdomen is soft.     Tenderness: There is no abdominal tenderness. There is no rebound.  Musculoskeletal:        General: Tenderness present.     Cervical back: Normal range of motion.     Comments: Minimal tenderness left wrist midline without swelling, pain on flexion  Skin:    General: Skin is warm and dry.  Neurological:     Mental Status: He is alert and oriented to person, place, and time.     Coordination: Coordination normal.    Vitals:   07/21/20 1446  BP: 108/68  Pulse: 72  Temp: 98.2 F (36.8 C)  TempSrc: Oral  SpO2: 95%  Weight: 173 lb 3.2 oz (78.6 kg)  Height: 4' 11.5" (1.511  m)    This visit occurred during the SARS-CoV-2 public health emergency.  Safety protocols were in place, including screening questions prior to the visit, additional usage of staff PPE, and extensive cleaning of exam room while observing appropriate contact time as indicated for disinfecting solutions.   Assessment & Plan:

## 2020-07-22 DIAGNOSIS — M25532 Pain in left wrist: Secondary | ICD-10-CM | POA: Insufficient documentation

## 2020-07-22 NOTE — Assessment & Plan Note (Signed)
Suspect mild tendonitis due to increased cycling. Advised to use voltaren gel topically at night time and before cycling. If no improvement let us know.

## 2020-08-09 DIAGNOSIS — G4733 Obstructive sleep apnea (adult) (pediatric): Secondary | ICD-10-CM | POA: Diagnosis not present

## 2020-09-01 ENCOUNTER — Ambulatory Visit (INDEPENDENT_AMBULATORY_CARE_PROVIDER_SITE_OTHER): Payer: BC Managed Care – PPO | Admitting: Neurology

## 2020-09-01 ENCOUNTER — Telehealth: Payer: Self-pay | Admitting: *Deleted

## 2020-09-01 ENCOUNTER — Encounter: Payer: Self-pay | Admitting: Neurology

## 2020-09-01 VITALS — BP 113/71 | HR 66 | Ht 60.0 in | Wt 173.0 lb

## 2020-09-01 DIAGNOSIS — G4733 Obstructive sleep apnea (adult) (pediatric): Secondary | ICD-10-CM | POA: Diagnosis not present

## 2020-09-01 DIAGNOSIS — Z9989 Dependence on other enabling machines and devices: Secondary | ICD-10-CM | POA: Diagnosis not present

## 2020-09-01 NOTE — Telephone Encounter (Signed)
Secure message sent to [facility] re: pressure change for CPAP.

## 2020-09-01 NOTE — Progress Notes (Signed)
Subjective:    Patient ID: Rodney Johnston is a 20 y.o. male.  HPI    Interim history:   Mr. Rubenstein is a 20 year old right-handed gentleman with an underlying history of celiac disease, asthma, allergies, hypothyroidism, vitamin D deficiency, Down syndrome, prediabetes and obesity, who presents for follow-up consultation of his obstructive sleep apnea after interim home sleep testing and starting AutoPap therapy.  Patient is accompanied by his mother again today.  I first met him on 06/03/2020 at the request of his pediatrician, at which time he reported significant daytime somnolence.  He was advised to proceed with a sleep study.  He had a home sleep test on 06/25/2020 which indicated severe obstructive sleep apnea with an AHI of 82.3/h, O2 nadir 71%.  He was advised to start AutoPap therapy.  Set up date was around mid June 2022.  Today, 09/01/2020: I reviewed his AutoPap compliance data from 07/21/2020 through 08/19/2020, which is a total of 30 days, during which time he used his machine every night except for 1 night, percent use days greater than 4 hours at 73%, indicating adequate compliance with an average usage of 5.4 hours.  Average AHI at goal at 3.6/h, 95th percentile of pressure at 7.5 cm with a range of 5-12.  95th percentile of leak at 14.5 L/min.  His mother reports that his leak improved and his tolerance has improved over time but also after he was switched from a full facemask to a nasal mask.  He really could not tolerate the full facemask and the moisture was too high, heat was too high and he actually had condensation dripping down his neck in the mornings.  He has done a great job using his machine and also took it on a recent trip.  He is very motivated to continue with treatment.  He has greatly benefited from treatment and that his headaches are improved, his daytime energy and daytime sleepiness are much improved.  Patient and his mom are very pleased with his outcome thus far.  He  is going to move back into the dorm in college on 09/18/2020.  The patient's allergies, current medications, family history, past medical history, past social history, past surgical history and problem list were reviewed and updated as appropriate.   Previously:   06/03/20: (He) reports feeling sleepy during the day.  He is currently home from school.  He is at [employer].  He reports going to bed around 10 or 11 and rise time is generally between 6 and 7.  He drinks caffeine in the form of soda, generally 1/day on average. His mother provides details of his history.  She reports that patient had his tonsils and adenoids out when he was about 20 years old.  At [age] and possibly at age 65 he had sleep studies at [facility].  He was not diagnosed with sleep apnea, certainly not enough to be on a CPAP machine.  Paternal grandparents have sleep apnea in both of machines.  Patient's father recently had a diagnosis of sleep apnea but not enough to be on a CPAP machine per mom.  She has noticed that his snoring has become worse and his sleepiness has also become worse, teachers have commented on his sleepiness.  He has gained weight in the realm of 30 pounds in the past 2 years.  He is in the process of working on weight loss. I reviewed your office note from 04/24/2020.  His Epworth sleepiness score is 16  out of 24.  He has complained of headaches in the night.  He reports getting up to go to the bathroom once per average night.   His Past Medical History Is Significant For: Past Medical History:  Diagnosis Date   Anesthesia complication    mother states difficulty maintaining airway under conscious sedation previously; was told to never undergo a procedure without artificial airway maintenance   Celiac disease    Down syndrome    c-spine films done 09/13/2012: no atlantoaxial instability   History of asthma    no treatment since 2010   Hypothyroid    Impacted teeth with abnormal position 10/2013    wisdom teeth   Loose joints    due to Down syndrome   Low muscle tone    Rash, skin 10/11/2013   multiple sites   Seasonal allergies    Sensitive skin    Sleep apnea    has had multiple sleep studies; mother states is in normal range, no CPAP use   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   Vitamin D deficiency     His Past Surgical History Is Significant For: Past Surgical History:  Procedure Laterality Date   DUODENAL ATRESIA REPAIR  08-07-99   EXPLORATORY LAPAROTOMY  Nov 15, 1999   HEMANGIOMA EXCISION  2012   from eye   [place]  10/02/2009   #R,Q,N,M   STRABISMUS SURGERY  2010, 2011   TEAR DUCT PROBING  2002   TONSILLECTOMY AND ADENOIDECTOMY  02/23/2001   TOOTH EXTRACTION  09/23/2011   Procedure: EXTRACTION MOLARS;  Surgeon: Ceasar Mons, DDS;  Location: [facility];  Service: Oral Surgery;  Laterality: N/A;  surgical removal of teeth A,B,C,H,J,K,T   TOOTH EXTRACTION N/A 10/17/2013   Procedure: EXTRACTION TEETH #17, 21, 28, 32, REMOVAL OF ODONTOGENIC TUMOR LEFT MANDIBLE, EXPOSE TOOTH #15 AND LABIAL FRENECTOMY    ;  Surgeon: Ceasar Mons, DDS;  Location: [facility];  Service: Oral Surgery;  Laterality: N/A;   TYMPANOSTOMY TUBE CHANGE W/ MLB  10/02/2009; 02/23/2001   revision right ear myringotomy; remove and replace left ear tube   TYMPANOSTOMY TUBE PLACEMENT  04/18/2000   UPPER GI ENDOSCOPY  2007    His Family History Is Significant For: Family History  Problem Relation Age of Onset   Diabetes Maternal Grandmother    Hypertension Maternal Grandmother    Arthritis Maternal Grandmother    Asthma Paternal Grandfather    Hearing loss Brother    Alcohol abuse Maternal Grandfather     His Social History Is Significant For: Social History   Socioeconomic History   Marital status: Single    Spouse name: Not on file   Number of children: Not on file   Years of education: Not on file   Highest education level:  Not on file  Occupational History   Not on file  Tobacco Use   Smoking status: Never   Smokeless tobacco: Never  Substance and Sexual Activity   Alcohol use: No   Drug use: No   Sexual activity: Not on file  Other Topics Concern   Not on file  Social History Narrative   Not on file   Social Determinants of Health   Financial Resource Strain: Not on file  Food Insecurity: Not on file  Transportation Needs: Not on file  Physical Activity: Not on file  Stress: Not on file  Social Connections: Not on file  His Allergies Are:  Allergies  Allergen Reactions   Wheat Bran Other (See Comments)    CELIAC DISEASE  :   His Current Medications Are:  Outpatient Encounter Medications as of 09/01/2020  Medication Sig   cetirizine (ZYRTEC) 10 MG tablet Take by mouth.   fluticasone (FLONASE) 50 MCG/ACT nasal spray Place into the nose.   ketoconazole (NIZORAL) 2 % shampoo Apply topically 3 (three) times a week.   levothyroxine (SYNTHROID, LEVOTHROID) 25 MCG tablet Take 10 mcg by mouth daily.   polyethylene glycol (MIRALAX / GLYCOLAX) packet Take 17 g by mouth daily.   No facility-administered encounter medications on file as of 09/01/2020.  :  Review of Systems:  Out of a complete 14 point review of systems, all are reviewed and negative with the exception of these symptoms as listed below:   Review of Systems  Neurological:        Pt here autopap fu, states he is doing well with autopap, no new questions or concerns.   Objective:  Neurological Exam  Physical Exam Physical Examination:   Vitals:   09/01/20 1001  BP: 113/71  Pulse: 66    General Examination: The patient is a very pleasant 20 y.o. male in no acute distress. He appears well-developed and well-nourished and well groomed.   HEENT: Normocephalic, atraumatic, pupils are equal, nystagmus noted in all gaze directions.  Face is symmetric, airway examination shows small airway entry, uvula not fully visualized,  tonsils absent.  Tongue protrudes centrally and palate elevates symmetrically but tip of uvula not visible.  Nasal inspection reveals small nasal passage.  No carotid bruits, neck range of motion normal.    Chest: Clear to auscultation without wheezing, rhonchi or crackles noted.   Heart: S1+S2+0, regular and normal without murmurs, rubs or gallops noted.   Abdomen: Soft, non-tender and non-distended.   Extremities: There is no pitting edema in the distal lower extremities bilaterally.   Skin: Warm and dry without trophic changes noted.   Musculoskeletal: exam reveals no obvious joint deformities.   Neurologically: Mental status: The patient is awake, alert and oriented in all 4 spheres. His immediate and remote memory, attention, language skills and fund of knowledge are appropriate. There is no evidence of aphasia, agnosia, apraxia or anomia. Speech is clear with normal prosody and enunciation. Thought process is linear. Mood is normal and affect is normal. Cranial nerves II - XII are as described above under HEENT exam. Motor exam: Normal bulk, strength and tone is noted. There is no tremor, fine motor skills and coordination: grossly intact. Cerebellar testing: No dysmetria or intention tremor. There is no truncal or gait ataxia. Sensory exam: intact to light touch in the upper and lower extremities. Gait, station and balance: He stands easily. No veering to one side is noted.  Posture is normal but stands laterally wider base.  He walks without difficulty.     Assessment and Plan:    In summary, BHARAT ANTILLON is a very pleasant 20 year old male with an underlying history of celiac disease, asthma, allergies, hypothyroidism, vitamin D deficiency, Down syndrome, prediabetes and obesity, who presents for follow-up consultation of his obstructive sleep apnea which was found to be in the severe range by home sleep testing in May 2021.  He has established treatment with AutoPap therapy  since June 2021 and is compliant with treatment.  He is highly commended for his treatment adherence.  Thankfully, he has also benefited from treatment and that his sleepiness during  the day is better, headaches at night are improved are essentially gone per mom's report and he has better sleep quality and sleep consolidation.  She is highly motivated to continue with treatment.  He is advised at this juncture to follow-up routinely in this clinic in 1 year, sooner if needed.  If the need arises, we can review his download in the interim as well.  I answered all their questions today and the patient and his mom were in agreement.  I spent 30 minutes in total face-to-face time and in reviewing records during pre-charting, more than 50% of which was spent in counseling and coordination of care, reviewing test results, reviewing medications and treatment regimen and/or in discussing or reviewing the diagnosis of OSA, the prognosis and treatment options. Pertinent laboratory and imaging test results that were available during this visit with the patient were reviewed by me and considered in my medical decision making (see chart for details).

## 2020-09-01 NOTE — Patient Instructions (Signed)
It was nice to see you both again today.  You have done a fantastic job using your AutoPap machine.  To help you tolerate the pressure little better I would like to reduce the maximum pressure from 12 cm to 10 cm at this time, [facility] should be able to change a pressure setting for you, you will probably have to take the machine in for the pressure change.  Please keep up the good work with using her machine, your apnea scores look very good.  Please continue using your autoPAP regularly. While your insurance requires that you use PAP at least 4 hours each night on 70% of the nights, I recommend, that you not skip any nights and use it throughout the night if you can. Getting used to PAP and staying with the treatment long term does take time and patience and discipline. Untreated obstructive sleep apnea when it is moderate to severe can have an adverse impact on cardiovascular health and raise her risk for heart disease, arrhythmias, hypertension, congestive heart failure, stroke and diabetes. Untreated obstructive sleep apnea causes sleep disruption, nonrestorative sleep, and sleep deprivation. This can have an impact on your day to day functioning and cause daytime sleepiness and impairment of cognitive function, memory loss, mood disturbance, and problems focussing. Using PAP regularly can improve these symptoms.  At this juncture we can see you back in 1 year, I would be happy to look at a compliance download report in about 6 months, we can have [facility] send [place] a report by fax.  Please check if they have a branch close to your college in [place].

## 2020-09-09 DIAGNOSIS — G4733 Obstructive sleep apnea (adult) (pediatric): Secondary | ICD-10-CM | POA: Diagnosis not present

## 2020-09-11 ENCOUNTER — Ambulatory Visit: Payer: Self-pay | Admitting: Neurology

## 2020-09-25 ENCOUNTER — Ambulatory Visit: Payer: Self-pay | Admitting: Neurology

## 2020-10-10 DIAGNOSIS — G4733 Obstructive sleep apnea (adult) (pediatric): Secondary | ICD-10-CM | POA: Diagnosis not present

## 2020-11-09 DIAGNOSIS — G4733 Obstructive sleep apnea (adult) (pediatric): Secondary | ICD-10-CM | POA: Diagnosis not present

## 2020-12-10 DIAGNOSIS — G4733 Obstructive sleep apnea (adult) (pediatric): Secondary | ICD-10-CM | POA: Diagnosis not present

## 2021-01-09 DIAGNOSIS — G4733 Obstructive sleep apnea (adult) (pediatric): Secondary | ICD-10-CM | POA: Diagnosis not present

## 2021-01-14 DIAGNOSIS — E559 Vitamin D deficiency, unspecified: Secondary | ICD-10-CM | POA: Diagnosis not present

## 2021-01-14 DIAGNOSIS — K900 Celiac disease: Secondary | ICD-10-CM | POA: Diagnosis not present

## 2021-01-14 DIAGNOSIS — E039 Hypothyroidism, unspecified: Secondary | ICD-10-CM | POA: Diagnosis not present

## 2021-01-14 DIAGNOSIS — R7303 Prediabetes: Secondary | ICD-10-CM | POA: Diagnosis not present

## 2021-01-15 DIAGNOSIS — R632 Polyphagia: Secondary | ICD-10-CM | POA: Insufficient documentation

## 2021-01-20 DIAGNOSIS — H5021 Vertical strabismus, right eye: Secondary | ICD-10-CM | POA: Diagnosis not present

## 2021-01-20 DIAGNOSIS — H52223 Regular astigmatism, bilateral: Secondary | ICD-10-CM | POA: Diagnosis not present

## 2021-01-20 DIAGNOSIS — H5501 Congenital nystagmus: Secondary | ICD-10-CM | POA: Diagnosis not present

## 2021-01-20 DIAGNOSIS — Q909 Down syndrome, unspecified: Secondary | ICD-10-CM | POA: Diagnosis not present

## 2021-02-09 DIAGNOSIS — G4733 Obstructive sleep apnea (adult) (pediatric): Secondary | ICD-10-CM | POA: Diagnosis not present

## 2021-03-12 DIAGNOSIS — G4733 Obstructive sleep apnea (adult) (pediatric): Secondary | ICD-10-CM | POA: Diagnosis not present

## 2021-06-18 DIAGNOSIS — G4733 Obstructive sleep apnea (adult) (pediatric): Secondary | ICD-10-CM | POA: Diagnosis not present

## 2021-07-19 ENCOUNTER — Encounter: Payer: Self-pay | Admitting: Internal Medicine

## 2021-07-19 DIAGNOSIS — G4733 Obstructive sleep apnea (adult) (pediatric): Secondary | ICD-10-CM | POA: Diagnosis not present

## 2021-07-19 NOTE — Patient Instructions (Addendum)
Medications changes include :   Augmentin twice daily x 10 days   Your prescription(s) have been sent to your pharmacy.    A referral was ordered for Dr Carlean Purl.     Someone from that office will call you to schedule an appointment.    Return in about 1 year (around 07/21/2022) for Physical Exam.    Health Maintenance, Male Adopting a healthy lifestyle and getting preventive care are important in promoting health and wellness. Ask your health care provider about: The right schedule for you to have regular tests and exams. Things you can do on your own to prevent diseases and keep yourself healthy. What should I know about diet, weight, and exercise? Eat a healthy diet  Eat a diet that includes plenty of vegetables, fruits, low-fat dairy products, and lean protein. Do not eat a lot of foods that are high in solid fats, added sugars, or sodium. Maintain a healthy weight Body mass index (BMI) is a measurement that can be used to identify possible weight problems. It estimates body fat based on height and weight. Your health care provider can help determine your BMI and help you achieve or maintain a healthy weight. Get regular exercise Get regular exercise. This is one of the most important things you can do for your health. Most adults should: Exercise for at least 150 minutes each week. The exercise should increase your heart rate and make you sweat (moderate-intensity exercise). Do strengthening exercises at least twice a week. This is in addition to the moderate-intensity exercise. Spend less time sitting. Even light physical activity can be beneficial. Watch cholesterol and blood lipids Have your blood tested for lipids and cholesterol at 22 years of age, then have this test every 5 years. You may need to have your cholesterol levels checked more often if: Your lipid or cholesterol levels are high. You are older than 22 years of age. You are at high risk for heart  disease. What should I know about cancer screening? Many types of cancers can be detected early and may often be prevented. Depending on your health history and family history, you may need to have cancer screening at various ages. This may include screening for: Colorectal cancer. Prostate cancer. Skin cancer. Lung cancer. What should I know about heart disease, diabetes, and high blood pressure? Blood pressure and heart disease High blood pressure causes heart disease and increases the risk of stroke. This is more likely to develop in people who have high blood pressure readings or are overweight. Talk with your health care provider about your target blood pressure readings. Have your blood pressure checked: Every 3-5 years if you are 15-45 years of age. Every year if you are 84 years old or older. If you are between the ages of 41 and 17 and are a current or former smoker, ask your health care provider if you should have a one-time screening for abdominal aortic aneurysm (AAA). Diabetes Have regular diabetes screenings. This checks your fasting blood sugar level. Have the screening done: Once every three years after age 83 if you are at a normal weight and have a low risk for diabetes. More often and at a younger age if you are overweight or have a high risk for diabetes. What should I know about preventing infection? Hepatitis B If you have a higher risk for hepatitis B, you should be screened for this virus. Talk with your health care provider to find out if you are  at risk for hepatitis B infection. Hepatitis C Blood testing is recommended for: Everyone born from [birth year] through 1966. Anyone with known risk factors for hepatitis C. Sexually transmitted infections (STIs) You should be screened each year for STIs, including gonorrhea and chlamydia, if: You are sexually active and are younger than 22 years of age. You are older than 22 years of age and your health care provider tells you  that you are at risk for this type of infection. Your sexual activity has changed since you were last screened, and you are at increased risk for chlamydia or gonorrhea. Ask your health care provider if you are at risk. Ask your health care provider about whether you are at high risk for HIV. Your health care provider may recommend a prescription medicine to help prevent HIV infection. If you choose to take medicine to prevent HIV, you should first get tested for HIV. You should then be tested every 3 months for as long as you are taking the medicine. Follow these instructions at home: Alcohol use Do not drink alcohol if your health care provider tells you not to drink. If you drink alcohol: Limit how much you have to 0-2 drinks a day. Know how much alcohol is in your drink. In the [place] one drink equals one 12 oz bottle of beer (355 mL), one 5 oz glass of wine (148 mL), or one 1 oz glass of hard liquor (44 mL). Lifestyle Do not use any products that contain nicotine or tobacco. These products include cigarettes, chewing tobacco, and vaping devices, such as e-cigarettes. If you need help quitting, ask your health care provider. Do not use street drugs. Do not share needles. Ask your health care provider for help if you need support or information about quitting drugs. General instructions Schedule regular health, dental, and eye exams. Stay current with your vaccines. Tell your health care provider if: You often feel depressed. You have ever been abused or do not feel safe at home. Summary Adopting a healthy lifestyle and getting preventive care are important in promoting health and wellness. Follow your health care provider's instructions about healthy diet, exercising, and getting tested or screened for diseases. Follow your health care provider's instructions on monitoring your cholesterol and blood pressure. This information is not intended to replace advice given to you by your health  care provider. Make sure you discuss any questions you have with your health care provider. Document Revised: 06/09/2020 Document Reviewed: 06/09/2020 Elsevier Patient Education  [place].

## 2021-07-19 NOTE — Progress Notes (Unsigned)
Subjective:    Patient ID: Rodney Johnston, male    DOB: 2000-09-19, 21 y.o.   MRN: [id number]     HPI Rodney Johnston is here for a physical exam.  He is here with his mother.  She helps provide some of the history.  He will be a senior in college next year.  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  He is also going to be traveling to see family and friends on his own.  He has been doing well overall.  Since being home from school he has lost about 12 pounds.  He is on the Vyvanse which is helping, but it seems to help more at home when he is eating healthier than at school when he is not eating as healthy and probably eating too much.  He has had a few episodes-maybe 4 at school and at home more recently where he has had choking episodes and was not able to bring up the food.  He states it felt like it got stuck in his middle esophagus.  He was bringing up clear mucus.  The most recent episode he brought up food but it was hours later.  He has always had issues with chewing his food well and his mother is not sure if that was the problem or if it is more of an esophageal issue.  He denies any issues swallowing his pills.  He is not able to tell if it occurs with certain foods.   Follicultis - chronic  -improved since doing the bleach baths since he has been home.  Clogged tear duct-he is not very good at massaging the area or putting on warm compresses.  He did have to start surgery when he was a baby bilaterally.  His right Radonna Ricker has always water more than the left eye.   Medications and allergies reviewed with patient and updated if appropriate.  Current Outpatient Medications on File Prior to Visit  Medication Sig Dispense Refill   cetirizine (ZYRTEC) 10 MG tablet Take by mouth.     fluticasone (FLONASE) 50 MCG/ACT nasal spray Place into the nose.     ketoconazole (NIZORAL) 2 % shampoo Apply topically 3 (three) times a week.     levothyroxine (SYNTHROID,  LEVOTHROID) 25 MCG tablet Take 10 mcg by mouth daily.     lisdexamfetamine (VYVANSE) 20 MG capsule Take 20 mg by mouth daily.     polyethylene glycol (MIRALAX / GLYCOLAX) packet Take 17 g by mouth daily.     ciprofloxacin-dexamethasone (CIPRODEX) OTIC suspension SMARTSIG:In Ear(s)     No current facility-administered medications on file prior to visit.    Review of Systems  Constitutional:  Negative for fever.  HENT:  Positive for trouble swallowing.   Eyes:  Negative for visual disturbance.  Respiratory:  Positive for choking. Negative for cough, shortness of breath and wheezing.   Cardiovascular:  Negative for chest pain, palpitations and leg swelling.  Gastrointestinal:  Positive for constipation. Negative for abdominal pain, blood in stool, diarrhea and nausea.       No gerd  Genitourinary:  Negative for difficulty urinating and dysuria.  Musculoskeletal:  Positive for arthralgias (wrist pain). Negative for back pain.  Skin:  Positive for rash (follicultuis).  Neurological:  Negative for dizziness, light-headedness and headaches.  Psychiatric/Behavioral:  Negative for dysphoric mood. The patient is not nervous/anxious.        Objective:   Vitals:   07/20/21 1341  BP: (!) 100/58  Pulse: 74  Temp: 97.7 F (36.5 C)  SpO2: 92%   Filed Weights   07/20/21 1341  Weight: 178 lb (80.7 kg)   Body mass index is 34.76 kg/m.  BP Readings from Last 3 Encounters:  07/20/21 (!) 100/58  09/01/20 113/71  07/21/20 108/68    Wt Readings from Last 3 Encounters:  07/20/21 178 lb (80.7 kg)  09/01/20 173 lb (78.5 kg)  07/21/20 173 lb 3.2 oz (78.6 kg)      Physical Exam Constitutional: He appears well-developed and well-nourished. No distress.  HENT:  Head: Normocephalic and atraumatic.  Right Ear: External ear normal.  Left Ear: External ear normal.  Mouth/Throat: Oropharynx is clear and moist.  Normal ear canals and TM b/l  Eyes: Conjunctivae and EOM are normal.  Neck:  Neck supple. No tracheal deviation present. No thyromegaly present.  No carotid bruit  Cardiovascular: Normal rate, regular rhythm, normal heart sounds and intact distal pulses.   No murmur heard. Pulmonary/Chest: Effort normal and breath sounds normal. No respiratory distress. He has no wheezes. He has no rales.  Abdominal: Soft. He exhibits no distension. There is no tenderness.  Genitourinary: deferred  Musculoskeletal: He exhibits no edema.  Lymphadenopathy:   He has no cervical adenopathy.  Skin: Skin is warm and dry. He is not diaphoretic.  Psychiatric: He has a normal mood and affect. His behavior is normal.         Assessment & Plan:   Physical exam: Screening blood work  ordered Exercise   regular - at least 2 workouts a week - sometimes daily Weight  working on weight loss Substance abuse   none   Reviewed recommended immunizations.   Health Maintenance  Topic Date Due   COVID-19 Vaccine (3 - Pfizer series) 08/05/2021 (Originally 06/11/2019)   HPV VACCINES (1 - Male 2-dose series) 07/21/2022 (Originally 04/15/2010)   TETANUS/TDAP  07/21/2022 (Originally 04/15/2018)   Hepatitis C Screening  07/21/2022 (Originally 04/14/2017)   HIV Screening  07/21/2022 (Originally 04/15/2014)   INFLUENZA VACCINE  09/01/2021     See Problem List for Assessment and Plan of chronic medical problems.

## 2021-07-20 ENCOUNTER — Telehealth: Payer: Self-pay | Admitting: Neurology

## 2021-07-20 ENCOUNTER — Ambulatory Visit (INDEPENDENT_AMBULATORY_CARE_PROVIDER_SITE_OTHER): Payer: Medicaid Other | Admitting: Internal Medicine

## 2021-07-20 VITALS — BP 100/58 | HR 74 | Temp 97.7°F | Ht 60.0 in | Wt 178.0 lb

## 2021-07-20 DIAGNOSIS — Z0000 Encounter for general adult medical examination without abnormal findings: Secondary | ICD-10-CM | POA: Diagnosis not present

## 2021-07-20 DIAGNOSIS — T17320A Food in larynx causing asphyxiation, initial encounter: Secondary | ICD-10-CM

## 2021-07-20 DIAGNOSIS — E039 Hypothyroidism, unspecified: Secondary | ICD-10-CM | POA: Diagnosis not present

## 2021-07-20 DIAGNOSIS — G4730 Sleep apnea, unspecified: Secondary | ICD-10-CM | POA: Diagnosis not present

## 2021-07-20 DIAGNOSIS — L739 Follicular disorder, unspecified: Secondary | ICD-10-CM | POA: Insufficient documentation

## 2021-07-20 DIAGNOSIS — H04301 Unspecified dacryocystitis of right lacrimal passage: Secondary | ICD-10-CM | POA: Insufficient documentation

## 2021-07-20 DIAGNOSIS — E559 Vitamin D deficiency, unspecified: Secondary | ICD-10-CM

## 2021-07-20 DIAGNOSIS — R7303 Prediabetes: Secondary | ICD-10-CM

## 2021-07-20 DIAGNOSIS — H5200 Hypermetropia, unspecified eye: Secondary | ICD-10-CM | POA: Insufficient documentation

## 2021-07-20 DIAGNOSIS — H5021 Vertical strabismus, right eye: Secondary | ICD-10-CM | POA: Insufficient documentation

## 2021-07-20 MED ORDER — AMOXICILLIN-POT CLAVULANATE 875-125 MG PO TABS: 1.0000 | ORAL_TABLET | Freq: Two times a day (BID) | ORAL | 0 refills | Status: DC

## 2021-07-20 NOTE — Telephone Encounter (Signed)
LVM and sent mychart msg informing pt of r/s needed for 7/26 appt- MD out during the afternoon.

## 2021-07-20 NOTE — Assessment & Plan Note (Addendum)
Chronic Taking vitamin d daily 

## 2021-07-20 NOTE — Assessment & Plan Note (Signed)
New He has had a few episodes of choking on food and feeling like it is getting stuck in his mid esophagus.  At times he has been able to bring the food up, but at times is only able to bring up clear mucus and then hours later will bring up the food He has never thoroughly chewed his food which could be partially the cause ?  Related to taking Vyvanse ?  Esophageal dysmotility or issues secondary to Down's your Vyvanse We will refer to GI

## 2021-07-20 NOTE — Assessment & Plan Note (Signed)
Chronic Has flares and when he came home it was more severe, but has improved at home doing the bleach baths, which we will continue Using topical creams and ointments prescribed by dermatology We will be prescribing Augmentin for his clogged tear duct and hopefully that will help as well

## 2021-07-20 NOTE — Assessment & Plan Note (Signed)
Acute Probably started 2 months ago Right medial tear duct seems to be clogged and infected There is some tenderness there and slight redness and at times his mother has seen some discharge from the area He is not very compliant with warm compresses and massage We will try Augmentin 875-125 mg twice daily x10 days If no improvement he will see his eye doctor

## 2021-07-20 NOTE — Assessment & Plan Note (Addendum)
Chronic A1c checked by endocrine recently Low sugar / carb diet Stressed regular exercise

## 2021-07-20 NOTE — Assessment & Plan Note (Signed)
Chronic management per endocrine

## 2021-07-20 NOTE — Assessment & Plan Note (Addendum)
Chronic Wearing cpap nightly

## 2021-07-28 ENCOUNTER — Encounter: Payer: Self-pay | Admitting: Nurse Practitioner

## 2021-08-11 ENCOUNTER — Telehealth: Payer: Self-pay | Admitting: Internal Medicine

## 2021-08-11 NOTE — Telephone Encounter (Signed)
PT's mom visits today with a form to be filled out regarding PT's recent physical. Form has been left in Dr.Burns' mailbox! PT's mom would like to be informed once this is filled out.   CB: [phone]

## 2021-08-11 NOTE — Telephone Encounter (Signed)
Form received and placed in provider's folder for signature.

## 2021-08-17 NOTE — Telephone Encounter (Signed)
Spoke with patient's mom today and form taken up front for pick up.

## 2021-08-18 DIAGNOSIS — G4733 Obstructive sleep apnea (adult) (pediatric): Secondary | ICD-10-CM | POA: Diagnosis not present

## 2021-08-19 DIAGNOSIS — R632 Polyphagia: Secondary | ICD-10-CM | POA: Diagnosis not present

## 2021-08-19 DIAGNOSIS — R7303 Prediabetes: Secondary | ICD-10-CM | POA: Diagnosis not present

## 2021-08-19 DIAGNOSIS — E559 Vitamin D deficiency, unspecified: Secondary | ICD-10-CM | POA: Diagnosis not present

## 2021-08-19 DIAGNOSIS — K900 Celiac disease: Secondary | ICD-10-CM | POA: Diagnosis not present

## 2021-08-19 DIAGNOSIS — E039 Hypothyroidism, unspecified: Secondary | ICD-10-CM | POA: Diagnosis not present

## 2021-08-25 ENCOUNTER — Other Ambulatory Visit: Payer: Medicaid Other

## 2021-08-25 ENCOUNTER — Ambulatory Visit (INDEPENDENT_AMBULATORY_CARE_PROVIDER_SITE_OTHER): Payer: Medicaid Other | Admitting: Nurse Practitioner

## 2021-08-25 ENCOUNTER — Encounter: Payer: Self-pay | Admitting: Nurse Practitioner

## 2021-08-25 VITALS — BP 100/68 | HR 62 | Ht 60.0 in | Wt 178.0 lb

## 2021-08-25 DIAGNOSIS — K900 Celiac disease: Secondary | ICD-10-CM | POA: Diagnosis not present

## 2021-08-25 DIAGNOSIS — R1310 Dysphagia, unspecified: Secondary | ICD-10-CM | POA: Diagnosis not present

## 2021-08-25 MED ORDER — PANTOPRAZOLE SODIUM 40 MG PO TBEC: DELAYED_RELEASE_TABLET | ORAL | 2 refills | Status: AC

## 2021-08-25 NOTE — Progress Notes (Signed)
I agree with the above note, plan 

## 2021-08-25 NOTE — Patient Instructions (Addendum)
If you are age 22 or younger, your body mass index should be between 19-25. Your Body mass index is 34.76 kg/m. If this is out of the aformentioned range listed, please consider follow up with your Primary Care Provider.  ________________________________________________________  The [facility] providers would like to encourage you to use [facility] to communicate with providers for non-urgent requests or questions.  Due to long hold times on the telephone, sending your provider a message by [facility] may be a faster and more efficient way to get a response.  Please allow 48 business hours for a response.  Please remember that this is for non-urgent requests.  _______________________________________________________  Your provider has requested that you go to the basement level for lab work before leaving today. Press "B" on the elevator. The lab is located at the first door on the left as you exit the elevator.  You have been scheduled for an endoscopy. Please follow written instructions given to you at your visit today. If you use inhalers (even only as needed), please bring them with you on the day of your procedure.  Until we can better understand what is going on with your swallowing be sure to take swallowing precautions. Eat slowly, chew food well before swallowing. Drink liquids in between each bite to avoid food impaction.  Thank you for entrusting me with your care and choosing [facility].  Tye Savoy, NP-C

## 2021-08-25 NOTE — Progress Notes (Signed)
Chief Complaint:  choking on food   Assessment & Plan   # 21 yo male with Down's syndrome and OSA having intermittent solid food dysphagia x 3 months.  Rule out  esophageal stricture, dysmotility, EoE.  Start pantoprazole 40 mg 30 minutes prior to lunch ( this will separate it from Synthroid in am).  Rodney Johnston has no problems swallowing pills.  Schedule for EGD at [facility]. Mother describes ? Difficult airway at time of 2009 EGD at [facility] back in 2009. The risks and benefits of EGD with possible biopsies were discussed with the patient and his mother and they both agree to proceed. Until we can complete evaluate of dysphagia patient was advised to take swallowing precautions. Eat slowly, chew food well before swallowing. Drink liquids in between each bite to avoid food impaction.    # Celiac disease, diagnosed > 10 years ago at [facility]. Avoids gluten Obtain tTg, IgA.   # Hypothyroidism.  Followed by endocrinologist with [facility]   HPI:     Rodney Johnston is a 21 year old male with a history of Down's syndrome hypothyroidism, nystagmus, prediabetes, vitamin D deficiency, celiac disease, obstructive sleep apnea  Patient is referred by PCP for evaluation of choking with food  Rodney Johnston is here with his mother. Rodney Johnston attends school at [employer] ( [employer]).. Rodney Johnston has been having intermittent problems swallowing food since April. Problems have mainly been when eating meat . Parents have witnessed two episodes, the others have been while Rodney Johnston was at school. At least a couple of times Rodney Johnston vomited the meat.   Mother gives a long history of not swallowing food well enough but Rodney Johnston has never had swallowing problems. Had reflux as a baby but not since.   Patient diagnosed with celiac disease several years ago. Rodney Johnston avoid gluten as it upsets his digestive system. Mother recalls ? Airway issues during endoscopic procedure.   08/19/21 CBC , BMET ( [facility]) were unremarkable.    Past Medical History:  Diagnosis  Date   Anesthesia complication    mother states difficulty maintaining airway under conscious sedation previously; was told to never undergo a procedure without artificial airway maintenance   Celiac disease    Down syndrome    c-spine films done 09/13/2012: no atlantoaxial instability   History of asthma    no treatment since 2010   Hypothyroid    Impacted teeth with abnormal position 10/2013   wisdom teeth   Loose joints    due to Down syndrome   Low muscle tone    Rash, skin 10/11/2013   multiple sites   Seasonal allergies    Sensitive skin    Sleep apnea    has had multiple sleep studies; mother states is in normal range, no CPAP use   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   Vitamin D deficiency    Past Surgical History:  Procedure Laterality Date   DUODENAL ATRESIA REPAIR  06/21/99   EXPLORATORY LAPAROTOMY  28-Jun-1999   HEMANGIOMA EXCISION  2012   from eye   [place]  10/02/2009   #R,Q,N,M   STRABISMUS SURGERY  2010, 2011   TEAR DUCT PROBING  2002   TONSILLECTOMY AND ADENOIDECTOMY  02/23/2001   TOOTH EXTRACTION  09/23/2011   Procedure: EXTRACTION MOLARS;  Surgeon: Ceasar Mons, DDS;  Location: [facility];  Service: Oral Surgery;  Laterality: N/A;  surgical removal of teeth A,B,C,H,J,K,T   TOOTH EXTRACTION N/A 10/17/2013  Procedure: EXTRACTION TEETH #17, 21, 28, 32, REMOVAL OF ODONTOGENIC TUMOR LEFT MANDIBLE, EXPOSE TOOTH #15 AND LABIAL FRENECTOMY    ;  Surgeon: Ceasar Mons, DDS;  Location: [facility];  Service: Oral Surgery;  Laterality: N/A;   TYMPANOSTOMY TUBE CHANGE W/ MLB  10/02/2009; 02/23/2001   revision right ear myringotomy; remove and replace left ear tube   TYMPANOSTOMY TUBE PLACEMENT  04/18/2000   UPPER GI ENDOSCOPY  2007   Family History  Problem Relation Age of Onset   Diabetes Maternal Grandmother    Hypertension Maternal Grandmother    Arthritis Maternal Grandmother    Asthma Paternal  Grandfather    Hearing loss Brother    Alcohol abuse Maternal Grandfather    Social History   Tobacco Use   Smoking status: Never   Smokeless tobacco: Never  Substance Use Topics   Alcohol use: No   Drug use: No   Current Outpatient Medications  Medication Sig Dispense Refill   amoxicillin-clavulanate (AUGMENTIN) 875-125 MG tablet Take 1 tablet by mouth 2 (two) times daily. 20 tablet 0   cetirizine (ZYRTEC) 10 MG tablet Take by mouth.     ciprofloxacin-dexamethasone (CIPRODEX) OTIC suspension SMARTSIG:In Ear(s)     fluticasone (FLONASE) 50 MCG/ACT nasal spray Place into the nose.     ketoconazole (NIZORAL) 2 % shampoo Apply topically 3 (three) times a week.     levothyroxine (SYNTHROID, LEVOTHROID) 25 MCG tablet Take 10 mcg by mouth daily.     lisdexamfetamine (VYVANSE) 20 MG capsule Take 20 mg by mouth daily.     polyethylene glycol (MIRALAX / GLYCOLAX) packet Take 17 g by mouth daily.     No current facility-administered medications for this visit.   Allergies  Allergen Reactions   Gluten Meal Other (See Comments)    Other Reaction: GI Upset-Celiac disease Other Reaction: GI Upset-Celiac disease    Wheat Bran Other (See Comments)    CELIAC DISEASE     Review of Systems: All systems reviewed and negative except where noted in HPI.   Wt Readings from Last 3 Encounters:  07/20/21 178 lb (80.7 kg)  09/01/20 173 lb (78.5 kg)  07/21/20 173 lb 3.2 oz (78.6 kg)    Physical Exam   BP 100/68   Pulse 62   Ht 5' (1.524 m)   Wt 178 lb (80.7 kg)   SpO2 97%   BMI 34.76 kg/m  Constitutional:  Generally well appearing male  with Down's syndrome in no acute distress. Psychiatric: Pleasant. Normal mood and affect. Behavior is normal. Answers questions appropriately.  EENT: Pupils normal.  Conjunctivae are normal. No scleral icterus. Neck supple.  Cardiovascular: Normal rate, regular rhythm. No edema Pulmonary/chest: Effort normal and breath sounds normal. No wheezing, rales  or rhonchi. Abdominal: Soft, nondistended, nontender. Bowel sounds active throughout. There are no masses palpable. No hepatomegaly. Neurological: Alert and oriented to person place and time. Skin: Skin is warm and dry. No rashes noted.  Tye Savoy, NP  08/25/2021, 8:33 AM  Cc:  Referring Provider Quay Burow, Claudina Lick, MD

## 2021-08-26 ENCOUNTER — Ambulatory Visit: Payer: BC Managed Care – PPO | Admitting: Neurology

## 2021-08-26 ENCOUNTER — Ambulatory Visit (INDEPENDENT_AMBULATORY_CARE_PROVIDER_SITE_OTHER): Payer: BC Managed Care – PPO | Admitting: Neurology

## 2021-08-26 ENCOUNTER — Encounter: Payer: Self-pay | Admitting: Neurology

## 2021-08-26 VITALS — BP 100/60 | HR 71 | Ht 60.0 in | Wt 181.2 lb

## 2021-08-26 DIAGNOSIS — Z9989 Dependence on other enabling machines and devices: Secondary | ICD-10-CM | POA: Diagnosis not present

## 2021-08-26 DIAGNOSIS — G4733 Obstructive sleep apnea (adult) (pediatric): Secondary | ICD-10-CM | POA: Diagnosis not present

## 2021-08-26 LAB — IGA: Immunoglobulin A: 282 mg/dL (ref 47–310)

## 2021-08-26 LAB — TISSUE TRANSGLUTAMINASE, IGA: (tTG) Ab, IgA: <1 U/mL

## 2021-08-26 NOTE — Progress Notes (Signed)
Subjective:    Patient ID: Rodney Johnston is a 21 y.o. male.  HPI    Interim history:   Rodney Johnston is a 21 year old right-handed gentleman with an underlying history of celiac disease, asthma, allergies, hypothyroidism, vitamin D deficiency, Down's syndrome, prediabetes and obesity, who presents for follow-up consultation of his obstructive sleep apnea, on autoPap therapy.  He is accompanied by his mother today. I last saw him on 09/01/20, at which time he was doing quite well, he had adjusted to treatment with his autoPAP machine. He had noted benefit from treatment and reported improvement in his headaches, mom felt that his daytime sleepiness was also much improved.  He was compliant with treatment.  He was advised to follow-up in 1 year routinely.  Today, 08/26/2021: I reviewed his AutoPap compliance data from the past month from 07/27/2021 through 08/25/2021, which is a total of 30 days, during which time he used his machine 24 days with percent use days greater than 4 hours at 73%, indicating adequate compliance, average usage right at 6 hours, residual AHI at goal at 2.8/h, average pressure for the 95th percentile at 6.6 cm with a range of 5-10, leak on the low side with the 95th percentile at 0.9 L/min.  He reports doing fairly well, history is primarily provided by his mom who reports that she sees a big difference in his sleepiness and alertness when he has used his AutoPap.  While in college at the dorm, he was not as consistent all the time, he would fall asleep without it on.  He has a restven Dentist which is a Soil scientist.  He is up-to-date with his supplies, uses a nasal mask but has had some chafing under his nose on the upper lip.  She has them use a Chapstick on the flaky area, he also has mild redness from the nasal mask alongside his nose.  She reports that he was much more noticeable when he came back from college.  It is possible that he is tightening the nasal mask  which also may explain very little leak from the mass.  They are encouraged to go away with his mask to the DME provider to make sure he is not tightening it too much in the fit and size are right.  He is cannot his job in August.  He is applying for another program for another 2 years at [employer], hopefully.  He recently visited his grandparents in [place].  His Epworth sleepiness score is 13 out of 24 but his mom believes that this reflects mostly symptoms from when he does not actually use his AutoPap consistently.   The patient's allergies, current medications, family history, past medical history, past social history, past surgical history and problem list were reviewed and updated as appropriate.    Previously:      I first met him on 06/03/2020 at the request of his pediatrician, at which time he reported significant daytime somnolence.  He was advised to proceed with a sleep study.  He had a home sleep test on 06/25/2020 which indicated severe obstructive sleep apnea with an AHI of 82.3/h, O2 nadir 71%.  He was advised to start AutoPap therapy.  Set up date was around mid June 2022.   I reviewed his AutoPap compliance data from 07/21/2020 through 08/19/2020, which is a total of 30 days, during which time he used his machine every night except for 1 night, percent use days greater than 4 hours at 73%,  indicating adequate compliance with an average usage of 5.4 hours.  Average AHI at goal at 3.6/h, 95th percentile of pressure at 7.5 cm with a range of 5-12.  95th percentile of leak at 14.5 L/min.   06/03/20: (He) reports feeling sleepy during the day.  He is currently home from school.  He is at [employer].  He reports going to bed around 10 or 11 and rise time is generally between 6 and 7.  He drinks caffeine in the form of soda, generally 1/day on average. His mother provides details of his history.  She reports that patient had his tonsils and adenoids out when he was about 21 years old.  At [age]  and possibly at age 25 he had sleep studies at [facility].  He was not diagnosed with sleep apnea, certainly not enough to be on a CPAP machine.  Paternal grandparents have sleep apnea in both of machines.  Patient's father recently had a diagnosis of sleep apnea but not enough to be on a CPAP machine per mom.  She has noticed that his snoring has become worse and his sleepiness has also become worse, teachers have commented on his sleepiness.  He has gained weight in the realm of 30 pounds in the past 2 years.  He is in the process of working on weight loss. I reviewed your office note from 04/24/2020.  His Epworth sleepiness score is 16 out of 24.  He has complained of headaches in the night.  He reports getting up to go to the bathroom once per average night.   His Past Medical History Is Significant For: Past Medical History:  Diagnosis Date   Anesthesia complication    mother states difficulty maintaining airway under conscious sedation previously; was told to never undergo a procedure without artificial airway maintenance   Celiac disease    Down syndrome    c-spine films done 09/13/2012: no atlantoaxial instability   History of asthma    no treatment since 2010   Hypothyroid    Impacted teeth with abnormal position 10/2013   wisdom teeth   Loose joints    due to Down syndrome   Low muscle tone    Rash, skin 10/11/2013   multiple sites   Seasonal allergies    Sensitive skin    Sleep apnea    has had multiple sleep studies; mother states is in normal range, no CPAP use   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   Vitamin D deficiency     His Past Surgical History Is Significant For: Past Surgical History:  Procedure Laterality Date   DUODENAL ATRESIA REPAIR  Aug 07, 1999   EXPLORATORY LAPAROTOMY  1999/05/08   HEMANGIOMA EXCISION  2012   from eye   [place]  10/02/2009   #R,Q,N,M   STRABISMUS SURGERY  2010, 2011   TEAR DUCT PROBING  2002    TONSILLECTOMY AND ADENOIDECTOMY  02/23/2001   TOOTH EXTRACTION  09/23/2011   Procedure: EXTRACTION MOLARS;  Surgeon: Ceasar Mons, DDS;  Location: [place];  Service: Oral Surgery;  Laterality: N/A;  surgical removal of teeth A,B,C,H,J,K,T   TOOTH EXTRACTION N/A 10/17/2013   Procedure: EXTRACTION TEETH #17, 21, 28, 32, REMOVAL OF ODONTOGENIC TUMOR LEFT MANDIBLE, EXPOSE TOOTH #15 AND LABIAL FRENECTOMY    ;  Surgeon: Ceasar Mons, DDS;  Location: [facility];  Service: Oral Surgery;  Laterality: N/A;   TYMPANOSTOMY  TUBE CHANGE W/ MLB  10/02/2009; 02/23/2001   revision right ear myringotomy; remove and replace left ear tube   TYMPANOSTOMY TUBE PLACEMENT  04/18/2000   UPPER GI ENDOSCOPY  2007    His Family History Is Significant For: Family History  Problem Relation Age of Onset   Irritable bowel syndrome Mother    Hearing loss Brother    Diabetes Maternal Grandmother    Hypertension Maternal Grandmother    Arthritis Maternal Grandmother    Alcohol abuse Maternal Grandfather    Asthma Paternal Grandfather    Colon cancer Neg Hx    Esophageal cancer Neg Hx    Rectal cancer Neg Hx    Stomach cancer Neg Hx     His Social History Is Significant For: Social History   Socioeconomic History   Marital status: Single    Spouse name: Not on file   Number of children: Not on file   Years of education: Not on file   Highest education level: Not on file  Occupational History   Not on file  Tobacco Use   Smoking status: Never   Smokeless tobacco: Never  Substance and Sexual Activity   Alcohol use: No    Comment: special occasion   Drug use: No   Sexual activity: Not on file  Other Topics Concern   Not on file  Social History Narrative   Not on file   Social Determinants of Health   Financial Resource Strain: Not on file  Food Insecurity: Not on file  Transportation Needs: Not on file  Physical Activity: Not on file  Stress: Not on file  Social  Connections: Not on file    His Allergies Are:  Allergies  Allergen Reactions   Gluten Meal Other (See Comments)    Other Reaction: GI Upset-Celiac disease Other Reaction: GI Upset-Celiac disease    Wheat Bran Other (See Comments)    CELIAC DISEASE  :   His Current Medications Are:  Outpatient Encounter Medications as of 08/26/2021  Medication Sig   cetirizine (ZYRTEC) 10 MG tablet Take by mouth as needed.   Cholecalciferol (VITAMIN D-1000 MAX ST) 25 MCG (1000 UT) tablet Take by mouth.   ciprofloxacin-dexamethasone (CIPRODEX) OTIC suspension as needed.   fluticasone (FLONASE) 50 MCG/ACT nasal spray Place into the nose.   ketoconazole (NIZORAL) 2 % shampoo Apply topically 3 (three) times a week.   levothyroxine (SYNTHROID, LEVOTHROID) 25 MCG tablet Take 10 mcg by mouth daily.   lisdexamfetamine (VYVANSE) 20 MG capsule Take 20 mg by mouth daily.   pantoprazole (PROTONIX) 40 MG tablet Take 1 tablet 30 minutes before lunch   polyethylene glycol (MIRALAX / GLYCOLAX) packet Take 17 g by mouth daily.   No facility-administered encounter medications on file as of 08/26/2021.  :  Review of Systems:  Out of a complete 14 point review of systems, all are reviewed and negative with the exception of these symptoms as listed below:   Review of Systems  Neurological:        Doing well when wears it.  Has some issues with dry area around mouth.  ESS 13, FSS 39.    Objective:  Neurological Exam  Physical Exam Physical Examination:   Vitals:   08/26/21 0729  BP: 100/60  Pulse: 71    General Examination: The patient is a very pleasant 21 y.o. male in no acute distress. He appears well-developed and well-nourished and well groomed.   HEENT: Normocephalic, atraumatic, pupils are equal, nystagmus noted in all  gaze directions.  Face is symmetric, airway examination shows small airway entry, uvula not fully visualized, tonsils absent.  Tongue protrudes centrally and palate elevates  symmetrically.  Mild redness around the nose, mild skin irritation with flaky skin under the nose, above upper lip. No carotid bruits, neck range of motion normal.    Chest: Clear to auscultation without wheezing, rhonchi or crackles noted.   Heart: S1+S2+0, regular and normal without murmurs, rubs or gallops noted.   Abdomen: Soft, non-tender and non-distended.   Extremities: There is no pitting edema in the distal lower extremities bilaterally.   Skin: Warm and dry without trophic changes noted.   Musculoskeletal: exam reveals no obvious joint deformities.   Neurologically: Mental status: The patient is awake, alert and oriented in all 4 spheres. His immediate and remote memory, attention, language skills and fund of knowledge are appropriate. There is no evidence of aphasia, agnosia, apraxia or anomia. Speech is clear with normal prosody and enunciation. Thought process is linear. Mood is normal and affect is normal. Cranial nerves II - XII are as described above under HEENT exam. Motor exam: Normal bulk, strength and tone is noted. There is no tremor, fine motor skills and coordination: grossly intact. Cerebellar testing: No dysmetria or intention tremor. There is no truncal or gait ataxia. Sensory exam: intact to light touch in the upper and lower extremities. Gait, station and balance: He stands easily. No veering to one side is noted. He walks without difficulty.     Assessment and Plan:    In summary, Rodney Johnston is a very pleasant 21 year old male with an underlying history of celiac disease, asthma, allergies, hypothyroidism, vitamin D deficiency, Down syndrome, prediabetes and obesity, who presents for follow-up consultation of his obstructive sleep apnea which was found to be in the severe range by home sleep testing in May 2021.  He started treatment with AutoPap therapy in June 2021 and is compliant with treatment. He has benefited from treatment, he is commended for his  treatment adherence and advised to continue with AutoPap therapy consistently.  Sometimes his usage becomes erratic when he is in college.  He usually does better when he is at home from school.  His mom has seen a big difference in his daytime energy and sleepiness when he uses his AutoPap consistently.  He is typically up-to-date with supplies, he is advised to use some Aquaphor under the nose for the chafing during the day.  He is advised to follow-up routinely in this clinic in 1 year, sooner if needed.  I answered all their questions today and the patient and his mom were in agreement.   I spent 30 minutes in total face-to-face time and in reviewing records during pre-charting, more than 50% of which was spent in counseling and coordination of care, reviewing test results, reviewing medications and treatment regimen and/or in discussing or reviewing the diagnosis of OSA on PAP therapy, the prognosis and treatment options. Pertinent laboratory and imaging test results that were available during this visit with the patient were reviewed by me and considered in my medical decision making (see chart for details).

## 2021-08-26 NOTE — Patient Instructions (Signed)
It was nice to see you both again today, you are using your AutoPap quite consistently but please keep in mind to use it also consistently when you are back in college.  You have benefited from treatment and your numbers look good. Please make an appointment with your DME provider, [facility] to make sure your mask fit is right and that you are not tightening the mask too much, use Aquaphor stick or Aquaphor ointment on the areas that show chafing. Use this in the morning after you take the mask off and let the area heal throughout the day, do not put any cream or ointment right before you put the mask on at night. Keep up the good work!  See you in 1 year.

## 2021-09-02 ENCOUNTER — Ambulatory Visit: Payer: BC Managed Care – PPO | Admitting: Neurology

## 2021-09-11 ENCOUNTER — Telehealth: Payer: Self-pay

## 2021-09-11 NOTE — Telephone Encounter (Signed)
The pt procedure with Dr Ardis Hughs has been moved to 10/20/21 at 730 am with Dr Havery Moros    Left message on machine to call back

## 2021-09-14 NOTE — Telephone Encounter (Signed)
Colon scheduled, pt instructed and medications reviewed.  Patient instructions mailed to home.  Patient to call with any questions or concerns.  

## 2021-10-12 ENCOUNTER — Encounter (HOSPITAL_COMMUNITY): Payer: Self-pay | Admitting: Gastroenterology

## 2021-10-13 ENCOUNTER — Encounter (HOSPITAL_COMMUNITY): Payer: Self-pay | Admitting: Gastroenterology

## 2021-10-19 ENCOUNTER — Telehealth: Payer: Self-pay | Admitting: Gastroenterology

## 2021-10-19 NOTE — Telephone Encounter (Signed)
Inbound call from patient mom stating patient has a procedure for 9/19 at [place] states patient has had a cold for the last 6 days. Mom states patient has no fever , patient has been doing his normal ADL on a daily but is not congested to the point where he can not breath. I have already spoken with a nurse but if needing to further advise please give mom a call back.  Thank you

## 2021-10-19 NOTE — Telephone Encounter (Signed)
Spoke with pts mother to see if pt has been tested for covid. She states he has not. She is going to test him and knows if he is positive then the procedure will need to be moved. Pt has had some congestion but no fever. Dr. Candis Schatz notified.

## 2021-10-20 ENCOUNTER — Ambulatory Visit (HOSPITAL_COMMUNITY)
Admission: RE | Admit: 2021-10-20 | Discharge: 2021-10-20 | Disposition: A | Discharge status: Home / Self Care | Payer: BC Managed Care – PPO | Attending: Gastroenterology | Admitting: Gastroenterology

## 2021-10-20 ENCOUNTER — Other Ambulatory Visit: Payer: Self-pay

## 2021-10-20 ENCOUNTER — Encounter (HOSPITAL_COMMUNITY)
Admission: RE | Disposition: A | Discharge status: Home / Self Care | Payer: Self-pay | Source: Home / Self Care | Attending: Gastroenterology

## 2021-10-20 ENCOUNTER — Ambulatory Visit (HOSPITAL_COMMUNITY): Payer: BC Managed Care – PPO | Admitting: Certified Registered Nurse Anesthetist

## 2021-10-20 ENCOUNTER — Ambulatory Visit (HOSPITAL_COMMUNITY): Payer: BC Managed Care – PPO

## 2021-10-20 ENCOUNTER — Encounter (HOSPITAL_COMMUNITY): Payer: Self-pay | Admitting: Gastroenterology

## 2021-10-20 DIAGNOSIS — R1310 Dysphagia, unspecified: Secondary | ICD-10-CM | POA: Diagnosis not present

## 2021-10-20 DIAGNOSIS — K2289 Other specified disease of esophagus: Secondary | ICD-10-CM | POA: Diagnosis not present

## 2021-10-20 DIAGNOSIS — G4733 Obstructive sleep apnea (adult) (pediatric): Secondary | ICD-10-CM | POA: Diagnosis not present

## 2021-10-20 DIAGNOSIS — K3189 Other diseases of stomach and duodenum: Secondary | ICD-10-CM | POA: Insufficient documentation

## 2021-10-20 DIAGNOSIS — K900 Celiac disease: Secondary | ICD-10-CM | POA: Diagnosis not present

## 2021-10-20 DIAGNOSIS — Q909 Down syndrome, unspecified: Secondary | ICD-10-CM | POA: Diagnosis not present

## 2021-10-20 DIAGNOSIS — E039 Hypothyroidism, unspecified: Secondary | ICD-10-CM | POA: Insufficient documentation

## 2021-10-20 DIAGNOSIS — K222 Esophageal obstruction: Secondary | ICD-10-CM | POA: Diagnosis not present

## 2021-10-20 DIAGNOSIS — G4730 Sleep apnea, unspecified: Secondary | ICD-10-CM | POA: Insufficient documentation

## 2021-10-20 DIAGNOSIS — Z9989 Dependence on other enabling machines and devices: Secondary | ICD-10-CM | POA: Diagnosis not present

## 2021-10-20 DIAGNOSIS — R918 Other nonspecific abnormal finding of lung field: Secondary | ICD-10-CM | POA: Diagnosis not present

## 2021-10-20 HISTORY — PX: BALLOON DILATION: SHX5330

## 2021-10-20 HISTORY — PX: BIOPSY: SHX5522

## 2021-10-20 HISTORY — PX: ESOPHAGOGASTRODUODENOSCOPY (EGD) WITH PROPOFOL: SHX5813

## 2021-10-20 HISTORY — DX: Family history of other specified conditions: Z84.89

## 2021-10-20 SURGERY — ESOPHAGOGASTRODUODENOSCOPY (EGD) WITH PROPOFOL
Anesthesia: Monitor Anesthesia Care

## 2021-10-20 MED ORDER — SODIUM CHLORIDE 0.9 % IV SOLN: INTRAVENOUS | Status: DC

## 2021-10-20 MED ORDER — LACTATED RINGERS IV SOLN
INTRAVENOUS | Status: DC
  Administered 2021-10-20: 1000 mL via INTRAVENOUS

## 2021-10-20 MED ORDER — ONDANSETRON HCL 4 MG/2ML IJ SOLN
INTRAMUSCULAR | Status: DC | PRN
  Administered 2021-10-20: 4 mg via INTRAVENOUS

## 2021-10-20 MED ORDER — PROPOFOL 500 MG/50ML IV EMUL
INTRAVENOUS | Status: AC
  Filled 2021-10-20: qty 50

## 2021-10-20 MED ORDER — LIDOCAINE 2% (20 MG/ML) 5 ML SYRINGE
INTRAMUSCULAR | Status: DC | PRN
  Administered 2021-10-20: 100 mg via INTRAVENOUS

## 2021-10-20 MED ORDER — PROPOFOL 500 MG/50ML IV EMUL
INTRAVENOUS | Status: DC | PRN
  Administered 2021-10-20: 100 ug/kg/min via INTRAVENOUS

## 2021-10-20 MED ORDER — DEXMEDETOMIDINE HCL IN NACL 80 MCG/20ML IV SOLN
INTRAVENOUS | Status: DC | PRN
  Administered 2021-10-20: 12 ug via BUCCAL
  Administered 2021-10-20: 8 ug via BUCCAL

## 2021-10-20 SURGICAL SUPPLY — 15 items

## 2021-10-20 NOTE — Anesthesia Preprocedure Evaluation (Signed)
Anesthesia Evaluation    History of Anesthesia Complications (+) PONV and history of anesthetic complications  Airway Mallampati: III  TM Distance: >3 FB Neck ROM: Full    Dental no notable dental hx.    Pulmonary sleep apnea and Continuous Positive Airway Pressure Ventilation ,    Pulmonary exam normal        Cardiovascular negative cardio ROS   Rhythm:Regular Rate:Normal     Neuro/Psych Down's syndrome negative psych ROS   GI/Hepatic Neg liver ROS, GERD  Medicated,  Endo/Other  Hypothyroidism   Renal/GU negative Renal ROS  negative genitourinary   Musculoskeletal negative musculoskeletal ROS (+)   Abdominal Normal abdominal exam  (+)   Peds  Hematology negative hematology ROS (+)   Anesthesia Other Findings   Reproductive/Obstetrics                             Anesthesia Physical Anesthesia Plan  ASA: 3  Anesthesia Plan: MAC   Post-op Pain Management:    Induction: Intravenous  PONV Risk Score and Plan: 2 and Propofol infusion and Treatment may vary due to age or medical condition  Airway Management Planned: Simple Face Mask, Natural Airway and Nasal Cannula  Additional Equipment: None  Intra-op Plan:   Post-operative Plan:   Informed Consent: I have reviewed the patients History and Physical, chart, labs and discussed the procedure including the risks, benefits and alternatives for the proposed anesthesia with the patient or authorized representative who has indicated his/her understanding and acceptance.     Dental advisory given and Consent reviewed with POA  Plan Discussed with:   Anesthesia Plan Comments:         Anesthesia Quick Evaluation

## 2021-10-20 NOTE — Anesthesia Procedure Notes (Signed)
Procedure Name: MAC Date/Time: 10/20/2021 7:37 AM  Performed by: West Pugh, CRNAPre-anesthesia Checklist: Patient identified, Emergency Drugs available, Suction available, Patient being monitored and Timeout performed Patient Re-evaluated:Patient Re-evaluated prior to induction Oxygen Delivery Method: Simple face mask Preoxygenation: Pre-oxygenation with 100% oxygen Induction Type: IV induction Placement Confirmation: positive ETCO2 Dental Injury: Teeth and Oropharynx as per pre-operative assessment

## 2021-10-20 NOTE — Discharge Instructions (Signed)
YOU HAD AN ENDOSCOPIC PROCEDURE TODAY: Refer to the procedure report and other information in the discharge instructions given to you for any specific questions about what was found during the examination. If this information does not answer your questions, please call [facility] office at [phone] to clarify.  ° °YOU SHOULD EXPECT: Some feelings of bloating in the abdomen. Passage of more gas than usual. Walking can help get rid of the air that was put into your GI tract during the procedure and reduce the bloating. If you had a lower endoscopy (such as a colonoscopy or flexible sigmoidoscopy) you may notice spotting of blood in your stool or on the toilet paper. Some abdominal soreness may be present for a day or two, also. ° °DIET: Your first meal following the procedure should be a light meal and then it is ok to progress to your normal diet. A half-sandwich or bowl of soup is an example of a good first meal. Heavy or fried foods are harder to digest and may make you feel nauseous or bloated. Drink plenty of fluids but you should avoid alcoholic beverages for 24 hours. If you had a esophageal dilation, please see attached instructions for diet.   ° °ACTIVITY: Your care partner should take you home directly after the procedure. You should plan to take it easy, moving slowly for the rest of the day. You can resume normal activity the day after the procedure however YOU SHOULD NOT DRIVE, use power tools, machinery or perform tasks that involve climbing or major physical exertion for 24 hours (because of the sedation medicines used during the test).  ° °SYMPTOMS TO REPORT IMMEDIATELY: °A gastroenterologist can be reached at any hour. Please call [phone]  for any of the following symptoms:  °Following lower endoscopy (colonoscopy, flexible sigmoidoscopy) °Excessive amounts of blood in the stool  °Significant tenderness, worsening of abdominal pains  °Swelling of the abdomen that is new, acute  °Fever of 100° or  higher  °Following upper endoscopy (EGD, EUS, ERCP, esophageal dilation) °Vomiting of blood or coffee ground material  °New, significant abdominal pain  °New, significant chest pain or pain under the shoulder blades  °Painful or persistently difficult swallowing  °New shortness of breath  °Black, tarry-looking or red, bloody stools ° °FOLLOW UP:  °If any biopsies were taken you will be contacted by phone or by letter within the next 1-3 weeks. Call [phone]  if you have not heard about the biopsies in 3 weeks.  °Please also call with any specific questions about appointments or follow up tests. ° °

## 2021-10-20 NOTE — Op Note (Signed)
[facility] Patient Name: Rodney Johnston Procedure Date: 10/20/2021 MRN: [id number] Attending MD: Gladstone Pih. Candis Schatz , MD Date of Birth: [birth year] CSN: [id number] Age: 21 Admit Type: Outpatient Procedure:                Upper GI endoscopy Indications:              Dysphagia, Celiac disease Providers:                Nicki Reaper E. Candis Schatz, MD, Elmer Ramp. Tilden Dome, RN, Cherylynn Ridges, Technician, Christell Faith, CRNA Referring MD:              Medicines:                Monitored Anesthesia Care Complications:            No immediate complications. Estimated Blood Loss:     Estimated blood loss was minimal. Procedure:                Pre-Anesthesia Assessment:                           - Prior to the procedure, a History and Physical                            was performed, and patient medications and                            allergies were reviewed. The patient's tolerance of                            previous anesthesia was also reviewed. The risks                            and benefits of the procedure and the sedation                            options and risks were discussed with the patient.                            All questions were answered, and informed consent                            was obtained. Prior Anticoagulants: The patient has                            taken no previous anticoagulant or antiplatelet                            agents. ASA Grade Assessment: III - A patient with                            severe systemic disease. After reviewing the risks  and benefits, the patient was deemed in                            satisfactory condition to undergo the procedure.                           After obtaining informed consent, the endoscope was                            passed under direct vision. Throughout the                            procedure, the patient's blood pressure, pulse, and                             oxygen saturations were monitored continuously. The                            [id number]-H190 ([id number]) Olympus endoscope was introduced                            through the mouth, and advanced to the third part                            of duodenum. The upper GI endoscopy was                            accomplished without difficulty. The patient                            tolerated the procedure well. Scope In: Scope Out: Findings:      One benign-appearing, intrinsic mild stenosis was found in the distal       esophagus. This stenosis measured 1.5 cm (inner diameter) x less than       one cm (in length). The stenosis was traversed. A TTS dilator was passed       through the scope. Dilation with an 18-19-20 mm balloon dilator was       performed to 20 mm. The dilation site was examined and showed no change.       Estimated blood loss: none.      The lower third of the esophagus was moderately tortuous.      Normal mucosa was found in the entire esophagus. Biopsies were obtained       from the proximal and distal esophagus with cold forceps for histology       of suspected eosinophilic esophagitis. Estimated blood loss was minimal.      A moderate-sized area of extrinsic compression was found in the upper       third of the esophagus.      The entire examined stomach was normal.      Normal mucosa was found in the entire duodenum. Biopsies for histology       were taken with a cold forceps for evaluation of celiac disease.       Estimated blood loss was minimal.      A deformity was found in the ampulla characterized by a  bulge located       between two papillary openings, and a dilated first portion of the       duodenum. The deformity was compressible when probed with forceps.       Biopsies were taken with a cold forceps for histology. Impression:               - Benign-appearing esophageal stenosis. Dilated.                           - Tortuous esophagus.                            - Normal mucosa was found in the entire esophagus.                            Biopsied.                           - Extrinsic compression in the upper third of the                            esophagus.                           - Normal stomach.                           - Normal mucosa was found in the entire examined                            duodenum. Biopsied.                           - Duodenal deformity. Biopsied. Moderate Sedation:      Not Applicable - Patient had care per Anesthesia. Recommendation:           - Patient has a contact number available for                            emergencies. The signs and symptoms of potential                            delayed complications were discussed with the                            patient. Return to normal activities tomorrow.                            Written discharge instructions were provided to the                            patient.                           - Resume previous diet.                           -  Continue present medications.                           - Await pathology results.                           - Recommend chest x-ray to evaluate possible                            extrinsic compression of the proximal esophagus.                           - Will discuss with Dr. Rush Landmark whether EUS vs                            MRCP is indicated to further evaluate ampullary                            abnormality Procedure Code(s):        --- Professional ---                           [phone], Esophagogastroduodenoscopy, flexible,                            transoral; with transendoscopic balloon dilation of                            esophagus (less than 30 mm diameter) Diagnosis Code(s):        --- Professional ---                           K22.2, Esophageal obstruction                           K31.89, Other diseases of stomach and duodenum                           R13.10, Dysphagia,  unspecified                           K90.0, Celiac disease CPT copyright 2019 American Medical Association. All rights reserved. The codes documented in this report are preliminary and upon coder review may  be revised to meet current compliance requirements. Moyses Pavey E. Candis Schatz, MD 10/20/2021 8:25:31 AM This report has been signed electronically. Number of Addenda: 0

## 2021-10-20 NOTE — Progress Notes (Signed)
Chest x-ray was obtained to assess for mediastinal abnormalities that may be causing extrinsic compression of the esophagus seen on EGD.  I discussed the normal chest x-ray with the patient's mother.   No further evaluation recommended at this time.

## 2021-10-20 NOTE — Transfer of Care (Signed)
Immediate Anesthesia Transfer of Care Note  Patient: Rodney Johnston  Procedure(s) Performed: ESOPHAGOGASTRODUODENOSCOPY (EGD) WITH PROPOFOL BIOPSY BALLOON DILATION  Patient Location: PACU  Anesthesia Type:MAC  Level of Consciousness: drowsy and patient cooperative  Airway & Oxygen Therapy: Patient Spontanous Breathing and Patient connected to face mask oxygen  Post-op Assessment: Report given to RN and Post -op Vital signs reviewed and stable  Post vital signs: Reviewed and stable  Last Vitals:  Vitals Value Taken Time  BP    Temp    Pulse 85 10/20/21 0826  Resp 16 10/20/21 0826  SpO2 98 % 10/20/21 0826  Vitals shown include unvalidated device data.  Last Pain:  Vitals:   10/20/21 0643  TempSrc: Oral  PainSc: 0-No pain         Complications: No notable events documented.

## 2021-10-20 NOTE — Anesthesia Postprocedure Evaluation (Signed)
Anesthesia Post Note  Patient: Rodney Johnston  Procedure(s) Performed: ESOPHAGOGASTRODUODENOSCOPY (EGD) WITH PROPOFOL BIOPSY BALLOON DILATION     Patient location during evaluation: Endoscopy Anesthesia Type: MAC Level of consciousness: awake and alert Pain management: pain level controlled Vital Signs Assessment: post-procedure vital signs reviewed and stable Respiratory status: spontaneous breathing, nonlabored ventilation, respiratory function stable and patient connected to nasal cannula oxygen Cardiovascular status: stable and blood pressure returned to baseline Postop Assessment: no apparent nausea or vomiting Anesthetic complications: no   No notable events documented.  Last Vitals:  Vitals:   10/20/21 0840 10/20/21 0850  BP: (!) 108/59 (!) 105/56  Pulse: 90 80  Resp: (!) 0 11  Temp:    SpO2: 97% 95%    Last Pain:  Vitals:   10/20/21 0850  TempSrc:   PainSc: 0-No pain                 [name]

## 2021-10-20 NOTE — H&P (Signed)
[facility] History and Physical   Primary Care Physician:  Binnie Rail, MD   Reason for Procedure:   Dysphagia  Plan:    EGD with possible dilation     HPI: Rodney Johnston is a 21 y.o. male with Down Syndrome and celiac disease undergoing EGD to evaluate chronic dysphagia.  Patient states this has been going on for a few years, but has been more noticeable in the past few months.  He has had multiple episodes of having to forcefully vomit stuck food, usually meats.  He has a remote history of GERD but denies symptoms currently.  No unintentional weight loss. He is mostly compliant with a gluten free diet.   Past Medical History:  Diagnosis Date   Anesthesia complication    mother states difficulty maintaining airway under conscious sedation previously; was told to never undergo a procedure without artificial airway maintenance   Celiac disease    Complication of anesthesia 2009   mother states difficulty maintaining airway under conscious sedation previously; was told to never undergo a procedure without artificial airway maintenance, trouble during EGD at [facility]   Down syndrome    c-spine films done 09/13/2012: no atlantoaxial instability   Family history of adverse reaction to anesthesia    mother and pt brother PONV   History of asthma    no treatment since 2010   Hypothyroid    Impacted teeth with abnormal position 10/02/2013   wisdom teeth   Loose joints    due to Down syndrome   Low muscle tone    Rash, skin 10/11/2013   multiple sites   Seasonal allergies    Sensitive skin    Sleep apnea    per mother severe, uses CPAP as much as he can tolerate   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   Vitamin D deficiency     Past Surgical History:  Procedure Laterality Date   DUODENAL ATRESIA REPAIR  1999-10-21   EXPLORATORY LAPAROTOMY  10-05-1999   HEMANGIOMA EXCISION  2012   from eye   [place]  10/02/2009    #R,Q,N,M   STRABISMUS SURGERY  2010, 2011   TEAR DUCT PROBING  2002   TONSILLECTOMY AND ADENOIDECTOMY  02/23/2001   TOOTH EXTRACTION  09/23/2011   Procedure: EXTRACTION MOLARS;  Surgeon: Ceasar Mons, DDS;  Location: [facility];  Service: Oral Surgery;  Laterality: N/A;  surgical removal of teeth A,B,C,H,J,K,T   TOOTH EXTRACTION N/A 10/17/2013   Procedure: EXTRACTION TEETH #17, 21, 28, 32, REMOVAL OF ODONTOGENIC TUMOR LEFT MANDIBLE, EXPOSE TOOTH #15 AND LABIAL FRENECTOMY    ;  Surgeon: Ceasar Mons, DDS;  Location: [facility];  Service: Oral Surgery;  Laterality: N/A;   TYMPANOSTOMY TUBE CHANGE W/ MLB  10/02/2009; 02/23/2001   revision right ear myringotomy; remove and replace left ear tube   TYMPANOSTOMY TUBE PLACEMENT  04/18/2000   UPPER GI ENDOSCOPY  2007    Prior to Admission medications   Medication Sig Start Date End Date Taking? Authorizing Provider  Cholecalciferol (VITAMIN D-1000 MAX ST) 25 MCG (1000 UT) tablet Take 1,000 Units by mouth in the morning.   Yes [provider]  ciprofloxacin-dexamethasone (CIPRODEX) OTIC suspension Place 4 drops into both ears 2 (two) times daily as needed (ear infections). 03/13/21  Yes [provider]  fluticasone (FLONASE) 50 MCG/ACT nasal spray Place 1 spray into the nose daily as needed for allergies (  during [place] months ONLY). 05/03/12  Yes [provider]  ibuprofen (ADVIL) 200 MG tablet Take 400 mg by mouth every 8 (eight) hours as needed (for pain.).   Yes [provider]  ketoconazole (NIZORAL) 2 % shampoo Apply 1 Application topically every other day. 06/05/20  Yes [provider]  levothyroxine (SYNTHROID, LEVOTHROID) 25 MCG tablet Take 25-37.5 mcg by mouth See admin instructions. Take 1 tablet (25 mcg) by mouth every other day--alternating with taking 1.5 tablets (37.5 mcg) by mouth every other day cyclically repeating.   Yes [provider]  pantoprazole  (PROTONIX) 40 MG tablet Take 1 tablet 30 minutes before lunch 08/25/21  Yes Willia Craze, NP  polyethylene glycol (MIRALAX / GLYCOLAX) packet Take 8.5 g by mouth in the morning.   Yes [provider]  VYVANSE 40 MG CHEW Chew 20 mg by mouth in the morning. 08/20/21  Yes [provider]  cetirizine (ZYRTEC) 10 MG tablet Take 10 mg by mouth daily as needed for allergies (during [place] months ONLY).    [provider]    Current Facility-Administered Medications  Medication Dose Route Frequency Provider Last Rate Last Admin   0.9 %  sodium chloride infusion   Intravenous Continuous Willia Craze, NP       lactated ringers infusion   Intravenous Continuous Daryel November, MD 10 mL/hr at 10/20/21 4259 Continued from Pre-op at 10/20/21 0702    Allergies as of 08/25/2021 - Review Complete 08/25/2021  Allergen Reaction Noted   Gluten meal Other (See Comments) 01/09/2019   Wheat bran Other (See Comments) 09/20/2011    Family History  Problem Relation Age of Onset   Irritable bowel syndrome Mother    Hearing loss Brother    Diabetes Maternal Grandmother    Hypertension Maternal Grandmother    Arthritis Maternal Grandmother    Alcohol abuse Maternal Grandfather    Asthma Paternal Grandfather    Colon cancer Neg Hx    Esophageal cancer Neg Hx    Rectal cancer Neg Hx    Stomach cancer Neg Hx     Social History   Socioeconomic History   Marital status: Single    Spouse name: Not on file   Number of children: Not on file   Years of education: Not on file   Highest education level: Not on file  Occupational History   Not on file  Tobacco Use   Smoking status: Never   Smokeless tobacco: Never  Substance and Sexual Activity   Alcohol use: No    Comment: special occasion   Drug use: No   Sexual activity: Not on file  Other Topics Concern   Not on file  Social History Narrative   Not on file   Social Determinants of Health   Financial  Resource Strain: Not on file  Food Insecurity: Not on file  Transportation Needs: Not on file  Physical Activity: Not on file  Stress: Not on file  Social Connections: Not on file  Intimate Partner Violence: Not on file    Review of Systems:  All other review of systems negative except as mentioned in the HPI.  Physical Exam: Vital signs BP 138/80   Pulse 81   Temp 97.9 F (36.6 C) (Oral)   Resp 19   Ht 5' (1.524 m)   Wt 81.2 kg   SpO2 98%   BMI 34.96 kg/m   General:   Alert,  Well-developed, well-nourished, pleasant and cooperative in NAD.  Accompanied by mother Airway:  Mallampati 3 Lungs:  Clear throughout to auscultation.   Heart:  Regular rate and rhythm; no murmurs, clicks, rubs,  or gallops. Abdomen:  Soft, nontender and nondistended. Normal bowel sounds.   Neuro/Psych:  Normal mood and affect. A and O x 3   Haja Crego E. Candis Schatz, MD [facility]

## 2021-10-20 NOTE — Progress Notes (Signed)
After recovery, patient went to radiology to have a chest x-ray. He did come back to Endoscopy to have his discharge instructions and to get dressed prior to being sent home.

## 2021-10-21 ENCOUNTER — Encounter (HOSPITAL_COMMUNITY): Payer: Self-pay | Admitting: Gastroenterology

## 2021-10-21 LAB — SURGICAL PATHOLOGY

## 2021-10-27 ENCOUNTER — Other Ambulatory Visit: Payer: Self-pay

## 2021-10-27 DIAGNOSIS — G4733 Obstructive sleep apnea (adult) (pediatric): Secondary | ICD-10-CM | POA: Diagnosis not present

## 2021-10-27 DIAGNOSIS — R1310 Dysphagia, unspecified: Secondary | ICD-10-CM

## 2021-10-27 NOTE — Progress Notes (Signed)
I discussed the patient's biopsy results with his mother over the phone today.  Duodenal biopsies were normal without any inflammatory or precancerous changes.  Esophageal biopsies were negative for changes of eosinophilic esophagitis, but were notable for histologic evidence of reflux esophagitis.  Because of this, I recommended he stay on a PPI for now, as this may be a source of his dysphagia.  I discussed further evaluation of dysphagia with esophageal manometry, but I was not clear that the patient would tolerate it very well, and suggested only doing this if his symptoms became a very significant issue for him.  Dr. Rush Landmark was kind enough to look at the pictures of his ampulla and did not feel that an endoscopic ultrasound was immediately necessary, but did recommend some sort of imaging, either with CT or MRI.  Given that the patient may require sedation for a successful MRI, I recommended we start out with a CT of the abdomen to assess for any biliary dilation or cysts.  Based on these findings we can decide whether MRI or EUS is necessary.  Vaughan Basta, Can you please order a CT of the abdomen with IV and oral contrast?  Patient lives out of town, but will be back in the area for fall break.  It should be fine to wait until then.

## 2021-11-26 DIAGNOSIS — G4733 Obstructive sleep apnea (adult) (pediatric): Secondary | ICD-10-CM | POA: Diagnosis not present

## 2021-12-22 ENCOUNTER — Ambulatory Visit (HOSPITAL_COMMUNITY)
Admission: RE | Admit: 2021-12-22 | Discharge: 2021-12-22 | Disposition: A | Discharge status: Home / Self Care | Payer: BC Managed Care – PPO | Source: Ambulatory Visit | Attending: Gastroenterology | Admitting: Gastroenterology

## 2021-12-22 ENCOUNTER — Encounter (HOSPITAL_COMMUNITY): Payer: Self-pay

## 2021-12-22 DIAGNOSIS — R1310 Dysphagia, unspecified: Secondary | ICD-10-CM | POA: Diagnosis not present

## 2021-12-22 MED ORDER — SODIUM CHLORIDE (PF) 0.9 % IJ SOLN
INTRAMUSCULAR | Status: AC
  Filled 2021-12-22: qty 50

## 2021-12-22 MED ORDER — IOHEXOL 300 MG/ML  SOLN
100.0000 mL | Freq: Once | INTRAMUSCULAR | Status: AC | PRN
  Administered 2021-12-22: 100 mL via INTRAVENOUS

## 2021-12-26 NOTE — Progress Notes (Signed)
Rodney Johnston,  Your CT scan did not show any abnormalities of the bile ducts.  The abnormalities seen on your endoscopy are likely related to your surgery as an infant, or from a benign fatty growth called a lipoma.  No further evaluation is recommended.

## 2021-12-27 DIAGNOSIS — G4733 Obstructive sleep apnea (adult) (pediatric): Secondary | ICD-10-CM | POA: Diagnosis not present

## 2022-01-13 DIAGNOSIS — R7303 Prediabetes: Secondary | ICD-10-CM | POA: Diagnosis not present

## 2022-01-13 DIAGNOSIS — E559 Vitamin D deficiency, unspecified: Secondary | ICD-10-CM | POA: Diagnosis not present

## 2022-01-13 DIAGNOSIS — R632 Polyphagia: Secondary | ICD-10-CM | POA: Diagnosis not present

## 2022-01-13 DIAGNOSIS — E039 Hypothyroidism, unspecified: Secondary | ICD-10-CM | POA: Diagnosis not present

## 2022-01-13 DIAGNOSIS — K900 Celiac disease: Secondary | ICD-10-CM | POA: Diagnosis not present

## 2022-01-20 DIAGNOSIS — Q909 Down syndrome, unspecified: Secondary | ICD-10-CM | POA: Diagnosis not present

## 2022-01-20 DIAGNOSIS — H5021 Vertical strabismus, right eye: Secondary | ICD-10-CM | POA: Diagnosis not present

## 2022-01-20 DIAGNOSIS — H5501 Congenital nystagmus: Secondary | ICD-10-CM | POA: Diagnosis not present

## 2022-01-20 DIAGNOSIS — H52223 Regular astigmatism, bilateral: Secondary | ICD-10-CM | POA: Diagnosis not present

## 2022-02-23 DIAGNOSIS — G4733 Obstructive sleep apnea (adult) (pediatric): Secondary | ICD-10-CM | POA: Diagnosis not present

## 2022-03-26 DIAGNOSIS — G4733 Obstructive sleep apnea (adult) (pediatric): Secondary | ICD-10-CM | POA: Diagnosis not present

## 2022-04-20 DIAGNOSIS — Z736 Limitation of activities due to disability: Secondary | ICD-10-CM | POA: Diagnosis not present

## 2022-04-20 DIAGNOSIS — H5501 Congenital nystagmus: Secondary | ICD-10-CM | POA: Diagnosis not present

## 2022-04-20 DIAGNOSIS — H539 Unspecified visual disturbance: Secondary | ICD-10-CM | POA: Diagnosis not present

## 2022-04-20 DIAGNOSIS — H542X11 Low vision right eye category 1, low vision left eye category 1: Secondary | ICD-10-CM | POA: Diagnosis not present

## 2022-04-24 DIAGNOSIS — G4733 Obstructive sleep apnea (adult) (pediatric): Secondary | ICD-10-CM | POA: Diagnosis not present

## 2022-06-24 ENCOUNTER — Encounter: Payer: Self-pay | Admitting: Neurology

## 2022-06-24 ENCOUNTER — Telehealth: Payer: Self-pay | Admitting: Neurology

## 2022-06-24 NOTE — Telephone Encounter (Signed)
LVM and sent letter in mail informing pt of need to reschedule 08/31/22 appt - MD out 

## 2022-06-30 DIAGNOSIS — E559 Vitamin D deficiency, unspecified: Secondary | ICD-10-CM | POA: Diagnosis not present

## 2022-06-30 DIAGNOSIS — R7303 Prediabetes: Secondary | ICD-10-CM | POA: Diagnosis not present

## 2022-06-30 DIAGNOSIS — E039 Hypothyroidism, unspecified: Secondary | ICD-10-CM | POA: Diagnosis not present

## 2022-06-30 DIAGNOSIS — R632 Polyphagia: Secondary | ICD-10-CM | POA: Diagnosis not present

## 2022-07-02 DIAGNOSIS — G4733 Obstructive sleep apnea (adult) (pediatric): Secondary | ICD-10-CM | POA: Diagnosis not present

## 2022-07-21 ENCOUNTER — Encounter: Payer: Self-pay | Admitting: Internal Medicine

## 2022-07-21 DIAGNOSIS — R739 Hyperglycemia, unspecified: Secondary | ICD-10-CM | POA: Insufficient documentation

## 2022-07-21 DIAGNOSIS — K219 Gastro-esophageal reflux disease without esophagitis: Secondary | ICD-10-CM | POA: Insufficient documentation

## 2022-07-21 NOTE — Progress Notes (Unsigned)
Subjective:    Patient ID: Rodney Johnston, male    DOB: [birth year], 22 y.o.   MRN: [id number]     HPI Rodney Johnston is here for follow up of his chronic medical problems.  A1c at [facility] 06/30/2022-5.7  Not exercising regularly.  He does exercise more regularly when he is home than when he is at school.  Has a lot of folliculitis.  Left arm /axilla, legs.  This tends to be worse in the summer.  Medications and allergies reviewed with patient and updated if appropriate.  Current Outpatient Medications on File Prior to Visit  Medication Sig Dispense Refill   cetirizine (ZYRTEC) 10 MG tablet Take 10 mg by mouth daily as needed for allergies (during SPR[place] months ONLY).     Cholecalciferol (VITAMIN D-1000 MAX ST) 25 MCG (1000 UT) tablet Take 1,000 Units by mouth in the morning.     ciprofloxacin-dexamethasone (CIPRODEX) OTIC suspension Place 4 drops into both ears 2 (two) times daily as needed (ear infections).     fluticasone (FLONASE) 50 MCG/ACT nasal spray Place 1 spray into the nose daily as needed for allergies (during SPR[place] months ONLY).     ibuprofen (ADVIL) 200 MG tablet Take 400 mg by mouth every 8 (eight) hours as needed (for pain.).     levothyroxine (SYNTHROID, LEVOTHROID) 25 MCG tablet Take 25-37.5 mcg by mouth See admin instructions. Take 1 tablet (25 mcg) by mouth every other day--alternating with taking 1.5 tablets (37.5 mcg) by mouth every other day cyclically repeating.     pantoprazole (PROTONIX) 40 MG tablet Take 1 tablet 30 minutes before lunch 30 tablet 2   polyethylene glycol (MIRALAX / GLYCOLAX) packet Take 8.5 g by mouth in the morning.     VYVANSE 40 MG CHEW Chew 20 mg by mouth in the morning.     ketoconazole (NIZORAL) 2 % shampoo Apply 1 Application topically every other day.     No current facility-administered medications on file prior to visit.     Review of Systems  Constitutional:  Negative for chills and fever.  Respiratory:  Positive for shortness  of breath. Negative for cough and wheezing.   Cardiovascular:  Positive for palpitations (sometimes). Negative for chest pain and leg swelling.  Gastrointestinal:  Negative for abdominal pain. Constipation: controlled.      Occ gerd  Musculoskeletal:  Negative for arthralgias and back pain.  Neurological:  Positive for light-headedness (sometimes) and headaches.  Psychiatric/Behavioral:  Negative for dysphoric mood. The patient is not nervous/anxious.        Objective:   Vitals:   07/22/22 1115  BP: 100/60  Pulse: 80  Temp: 98.6 F (37 C)  SpO2: 95%   BP Readings from Last 3 Encounters:  07/22/22 100/60  10/20/21 (!) 105/56  08/26/21 100/60   Wt Readings from Last 3 Encounters:  07/22/22 184 lb 3.2 oz (83.6 kg)  10/20/21 179 lb (81.2 kg)  08/26/21 181 lb 3.2 oz (82.2 kg)   Body mass index is 35.97 kg/m.    Physical Exam Constitutional:      General: He is not in acute distress.    Appearance: Normal appearance. He is not ill-appearing.  HENT:     Head: Normocephalic and atraumatic.  Eyes:     Conjunctiva/sclera: Conjunctivae normal.  Cardiovascular:     Rate and Rhythm: Normal rate and regular rhythm.     Heart sounds: Normal heart sounds.  Pulmonary:     Effort: Pulmonary effort is  normal. No respiratory distress.     Breath sounds: Normal breath sounds. No wheezing or rales.  Abdominal:     General: There is no distension.     Palpations: Abdomen is soft.     Tenderness: There is no abdominal tenderness. There is no guarding or rebound.  Musculoskeletal:     Right lower leg: No edema.     Left lower leg: No edema.  Skin:    General: Skin is warm and dry.     Findings: Rash (Folliculitis chest, abdomen, left axilla primarily) present.  Neurological:     Mental Status: He is alert. Mental status is at baseline.  Psychiatric:        Mood and Affect: Mood normal.        Lab Results  Component Value Date   WBC 9.4 01/16/2009   HGB 12.0 01/16/2009    HCT 33.7 01/16/2009   PLT 363 01/16/2009   GLUCOSE 102 (H) 01/16/2009   ALT 18 01/16/2009   AST 25 01/16/2009   NA 140 01/16/2009   K 4.4 01/16/2009   CL 100 01/16/2009   CREATININE 0.70 01/16/2009   BUN 10 01/16/2009   CO2 31 01/16/2009     Assessment & Plan:    See Problem List for Assessment and Plan of chronic medical problems.

## 2022-07-21 NOTE — Patient Instructions (Addendum)
      Blood work was ordered.   The lab is on the first floor.    Medications changes include :       A referral was ordered and someone will call you to schedule an appointment.     Return in about 1 year (around 07/22/2023) for follow up.

## 2022-07-22 ENCOUNTER — Ambulatory Visit (INDEPENDENT_AMBULATORY_CARE_PROVIDER_SITE_OTHER): Payer: BC Managed Care – PPO | Admitting: Internal Medicine

## 2022-07-22 VITALS — BP 100/60 | HR 80 | Temp 98.6°F | Ht 60.0 in | Wt 184.2 lb

## 2022-07-22 DIAGNOSIS — E039 Hypothyroidism, unspecified: Secondary | ICD-10-CM | POA: Diagnosis not present

## 2022-07-22 DIAGNOSIS — R7303 Prediabetes: Secondary | ICD-10-CM

## 2022-07-22 DIAGNOSIS — G4730 Sleep apnea, unspecified: Secondary | ICD-10-CM

## 2022-07-22 DIAGNOSIS — E559 Vitamin D deficiency, unspecified: Secondary | ICD-10-CM

## 2022-07-22 DIAGNOSIS — K219 Gastro-esophageal reflux disease without esophagitis: Secondary | ICD-10-CM

## 2022-07-22 DIAGNOSIS — L739 Follicular disorder, unspecified: Secondary | ICD-10-CM

## 2022-07-22 MED ORDER — AMOXICILLIN-POT CLAVULANATE 875-125 MG PO TABS: 1.0000 | ORAL_TABLET | Freq: Two times a day (BID) | ORAL | 0 refills | Status: DC

## 2022-07-22 NOTE — Assessment & Plan Note (Signed)
Chronic Recent A1c 5.7 at [facility] Low sugar / carb diet Stressed regular exercise

## 2022-07-22 NOTE — Assessment & Plan Note (Addendum)
Chronic Controlled Continue pantoprazole 40 mg daily as needed-typically it is triggered by certain foods that he eats

## 2022-07-22 NOTE — Assessment & Plan Note (Signed)
Chronic Taking vitamin d daily 

## 2022-07-22 NOTE — Assessment & Plan Note (Signed)
Chronic Has flares-tends to be worse in the summertime. Using topical creams and ointments prescribed by dermatology Start Augmentin 875-125 mg twice daily x 10 days

## 2022-07-22 NOTE — Assessment & Plan Note (Signed)
Chronic management per endocrine Taking levothyroxine 25 mcg alternating with 37.5 mcg

## 2022-07-22 NOTE — Assessment & Plan Note (Addendum)
Chronic Wearing cpap nightly when he is at home, but is less consistent with dryness at school He understands the importance of wearing this on a regular basis

## 2022-08-01 DIAGNOSIS — G4733 Obstructive sleep apnea (adult) (pediatric): Secondary | ICD-10-CM | POA: Diagnosis not present

## 2022-08-18 ENCOUNTER — Encounter: Payer: Self-pay | Admitting: Adult Health

## 2022-08-18 ENCOUNTER — Ambulatory Visit (INDEPENDENT_AMBULATORY_CARE_PROVIDER_SITE_OTHER): Payer: BC Managed Care – PPO | Admitting: Adult Health

## 2022-08-18 VITALS — BP 113/67 | HR 79

## 2022-08-18 DIAGNOSIS — G4733 Obstructive sleep apnea (adult) (pediatric): Secondary | ICD-10-CM | POA: Diagnosis not present

## 2022-08-18 NOTE — Progress Notes (Signed)
PATIENT: Rodney Johnston DOB: [birth year]  REASON FOR VISIT: follow up HISTORY FROM: patient PRIMARY NEUROLOGIST: Rodney Johnston  Chief Complaint  Patient presents with   Follow-up    Pt in 19  with mom Pt states can hear water bubbling in tube . Pt states some fatigue.      HISTORY OF PRESENT ILLNESS: Today 08/18/22:  Rodney Johnston is a 22 y.o. male with a history of OSA on CPAP. Returns today for follow-up.  His download indicates that he used the machine 18 out of 30 days for compliance of 60%.  On average he uses the machine 7 hours.  His residual AHI is 2 on 5 to 10 cm of water with an EPR of 2.  Overall he reports that the CPAP does make him feel better.  His mother reports that his teachers at college can tell a difference when he does not use his machine.  She states sometimes he will watch his iPad in bed and fall asleep before putting it on.  I encouraged him to set an alarm.     HISTORY 08/26/2021: I reviewed his AutoPap compliance data from the past month from 07/27/2021 through 08/25/2021, which is a total of 30 days, during which time he used his machine 24 days with percent use days greater than 4 hours at 73%, indicating adequate compliance, average usage right at 6 hours, residual AHI at goal at 2.8/h, average pressure for the 95th percentile at 6.6 cm with a range of 5-10, leak on the low side with the 95th percentile at 0.9 L/min.  He reports doing fairly well, history is primarily provided by his mom who reports that she sees a big difference in his sleepiness and alertness when he has used his AutoPap.  While in college at the dorm, he was not as consistent all the time, he would fall asleep without it on.  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  He is up-to-date with his supplies, uses a nasal mask but has had some chafing under his nose on the upper lip.  She has them use a Chapstick on the flaky area, he also has mild redness from the nasal mask  alongside his nose.  She reports that he was much more noticeable when he came back from college.  It is possible that he is tightening the nasal mask which also may explain very little leak from the mass.  They are encouraged to go away with his mask to the DME provider to make sure he is not tightening it too much in the fit and size are right.  He is cannot his job in August.  He is applying for another program for another 2 years at [employer], hopefully.  He recently visited his grandparents in [place].  His Epworth sleepiness score is 13 out of 24 but his mom believes that this reflects mostly symptoms from when he does not actually use his AutoPap consistently.   The patient's allergies, current medications, family history, past medical history, past social history, past surgical history and problem list were reviewed and updated as appropriate.   REVIEW OF SYSTEMS: Out of a complete 14 system review of symptoms, the patient complains only of the following symptoms, and all other reviewed systems are negative.  FSS ESS  ALLERGIES: Allergies  Allergen Reactions   Gluten Meal Other (See Comments)    Other Reaction: GI Upset-Celiac disease Other Reaction: GI Upset-Celiac disease  Wheat Other (See Comments)    CELIAC DISEASE    HOME MEDICATIONS: Outpatient Medications Prior to Visit  Medication Sig Dispense Refill   amoxicillin-clavulanate (AUGMENTIN) 875-125 MG tablet Take 1 tablet by mouth 2 (two) times daily. 20 tablet 0   cetirizine (ZYRTEC) 10 MG tablet Take 10 mg by mouth daily as needed for allergies (during SPRING months ONLY).     Cholecalciferol (VITAMIN D-1000 MAX ST) 25 MCG (1000 UT) tablet Take 1,000 Units by mouth in the morning.     ciprofloxacin-dexamethasone (CIPRODEX) OTIC suspension Place 4 drops into both ears 2 (two) times daily as needed (ear infections).     fluticasone (FLONASE) 50 MCG/ACT nasal spray Place 1 spray into the nose daily as needed for allergies  (during SPRING months ONLY).     ibuprofen (ADVIL) 200 MG tablet Take 400 mg by mouth every 8 (eight) hours as needed (for pain.).     levothyroxine (SYNTHROID, LEVOTHROID) 25 MCG tablet Take 25-37.5 mcg by mouth See admin instructions. Take 1 tablet (25 mcg) by mouth every other day--alternating with taking 1.5 tablets (37.5 mcg) by mouth every other day cyclically repeating.     pantoprazole (PROTONIX) 40 MG tablet Take 1 tablet 30 minutes before lunch 30 tablet 2   polyethylene glycol (MIRALAX / GLYCOLAX) packet Take 8.5 g by mouth in the morning.     VYVANSE 40 MG CHEW Chew 20 mg by mouth in the morning.     No facility-administered medications prior to visit.    PAST MEDICAL HISTORY: Past Medical History:  Diagnosis Date   Anesthesia complication    mother states difficulty maintaining airway under conscious sedation previously; was told to never undergo a procedure without artificial airway maintenance   Celiac disease    Complication of anesthesia 2009   mother states difficulty maintaining airway under conscious sedation previously; was told to never undergo a procedure without artificial airway maintenance, trouble during EGD at [facility]   Down syndrome    c-spine films done 09/13/2012: no atlantoaxial instability   Family history of adverse reaction to anesthesia    mother and pt brother PONV   History of asthma    no treatment since 2010   Hypothyroid    Impacted teeth with abnormal position 10/02/2013   wisdom teeth   Loose joints    due to Down syndrome   Low muscle tone    Rash, skin 10/11/2013   multiple sites   Seasonal allergies    Sensitive skin    Sleep apnea    per mother severe, uses CPAP as much as he can tolerate   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   Vitamin D deficiency     PAST SURGICAL HISTORY: Past Surgical History:  Procedure Laterality Date   BALLOON DILATION N/A 10/20/2021   Procedure: BALLOON DILATION;  Surgeon: Jenel Lucks, MD;   Location: [facility];  Service: Gastroenterology;  Laterality: N/A;   BIOPSY  10/20/2021   Procedure: BIOPSY;  Surgeon: Jenel Lucks, MD;  Location: [facility];  Service: Gastroenterology;;   DUODENAL ATRESIA REPAIR  02/21/99   ESOPHAGOGASTRODUODENOSCOPY (EGD) WITH PROPOFOL N/A 10/20/2021   Procedure: ESOPHAGOGASTRODUODENOSCOPY (EGD) WITH PROPOFOL;  Surgeon: Jenel Lucks, MD;  Location: [facility];  Service: Gastroenterology;  Laterality: N/A;   EXPLORATORY LAPAROTOMY  1999-04-18   HEMANGIOMA EXCISION  2012   from eye   MASTOIDECTOMY     MULTIPLE TOOTH EXTRACTIONS  10/02/2009   #R,Q,N,M   STRABISMUS SURGERY  2010, 2011   TEAR DUCT PROBING  2002   TONSILLECTOMY AND ADENOIDECTOMY  02/23/2001   TOOTH EXTRACTION  09/23/2011   Procedure: EXTRACTION MOLARS;  Surgeon: Hinton Dyer, DDS;  Location: [facility];  Service: Oral Surgery;  Laterality: N/A;  surgical removal of teeth A,B,C,H,J,K,T   TOOTH EXTRACTION N/A 10/17/2013   Procedure: EXTRACTION TEETH #17, 21, 28, 32, REMOVAL OF ODONTOGENIC TUMOR LEFT MANDIBLE, EXPOSE TOOTH #15 AND LABIAL FRENECTOMY    ;  Surgeon: Hinton Dyer, DDS;  Location: [facility];  Service: Oral Surgery;  Laterality: N/A;   TYMPANOSTOMY TUBE CHANGE W/ MLB  10/02/2009; 02/23/2001   revision right ear myringotomy; remove and replace left ear tube   TYMPANOSTOMY TUBE PLACEMENT  04/18/2000   UPPER GI ENDOSCOPY  2007    FAMILY HISTORY: Family History  Problem Relation Age of Onset   Irritable bowel syndrome Mother    Hearing loss Brother    Diabetes Maternal Grandmother    Hypertension Maternal Grandmother    Arthritis Maternal Grandmother    Alcohol abuse Maternal Grandfather    Asthma Paternal Grandfather    Colon cancer Neg Hx    Esophageal cancer Neg Hx    Rectal cancer Neg Hx    Stomach cancer Neg Hx     SOCIAL HISTORY: Social History   Socioeconomic History   Marital status: Single    Spouse name: Not on  file   Number of children: Not on file   Years of education: Not on file   Highest education level: Associate degree: occupational, Scientist, product/process development, or vocational program  Occupational History   Not on file  Tobacco Use   Smoking status: Never   Smokeless tobacco: Never  Substance and Sexual Activity   Alcohol use: No    Comment: special occasion   Drug use: No   Sexual activity: Not on file  Other Topics Concern   Not on file  Social History Narrative   Not on file   Social Determinants of Health   Financial Resource Strain: Low Risk  (06/29/2022)   Received [facility]   Overall Financial Resource Strain (CARDIA)    Difficulty of Paying Living Expenses: Not hard at all  Food Insecurity: No Food Insecurity (07/22/2022)   Hunger Vital Sign    Worried About Running Out of Food in the Last Year: Never true    Ran Out of Food in the Last Year: Never true  Transportation Needs: No Transportation Needs (07/22/2022)   PRAPARE - Administrator, Civil Service (Medical): No    Lack of Transportation (Non-Medical): No  Physical Activity: Sufficiently Active (07/22/2022)   Exercise Vital Sign    Days of Exercise per Week: 5 days    Minutes of Exercise per Session: 30 min  Stress: No Stress Concern Present (07/22/2022)   [employer] of Occupational Health - Occupational Stress Questionnaire    Feeling of Stress : Not at all  Social Connections: Unknown (07/22/2022)   Social Connection and Isolation Panel [NHANES]    Frequency of Communication with Friends and Family: More than three times a week    Frequency of Social Gatherings with Friends and Family: More than three times a week    Attends Religious Services: Never    Database administrator or Organizations: Not on file    Attends Banker Meetings: Not on file    Marital Status: Never married  Intimate Partner Violence: Not on file      PHYSICAL  EXAM  Vitals:   08/18/22 0957  BP: 113/67  Pulse: 79   There is no height or weight on file to calculate BMI.  Generalized: Well developed, in no acute distress  Chest: Lungs clear to auscultation bilaterally  Neurological examination  Mentation: Alert oriented to time, place, history taking. Follows all commands speech and language fluent Cranial nerve II-XII: Facial symmetry noted   DIAGNOSTIC DATA (LABS, IMAGING, TESTING) - I reviewed patient records, labs, notes, testing and imaging myself where available.  Lab Results  Component Value Date   WBC 9.4 01/16/2009   HGB 12.0 01/16/2009   HCT 33.7 01/16/2009   MCV 98.4 (H) 01/16/2009   PLT 363 01/16/2009      Component Value Date/Time   NA 140 01/16/2009 2158   K 4.4 01/16/2009 2158   CL 100 01/16/2009 2158   CO2 31 01/16/2009 2158   GLUCOSE 102 (H) 01/16/2009 2158   BUN 10 01/16/2009 2158   CREATININE 0.70 01/16/2009 2158   CALCIUM 8.4 01/16/2009 2158   PROT 6.4 01/16/2009 2158   ALBUMIN 3.1 (L) 01/16/2009 2158   AST 25 01/16/2009 2158   ALT 18 01/16/2009 2158   ALKPHOS 127 01/16/2009 2158   BILITOT 0.6 01/16/2009 2158       ASSESSMENT AND PLAN 22 y.o. year old male  has a past medical history of Anesthesia complication, Celiac disease, Complication of anesthesia (2009), Down syndrome, Family history of adverse reaction to anesthesia, History of asthma, Hypothyroid, Impacted teeth with abnormal position (10/02/2013), Loose joints, Low muscle tone, Rash, skin (10/11/2013), Seasonal allergies, Sensitive skin, Sleep apnea, Tumor of jaw (10/02/2013), and Vitamin D deficiency. here with:  OSA on CPAP  - CPAP compliance good - Good treatment of AHI  - Encourage patient to use CPAP nightly and > 4 hours each night - F/U in 1 year or sooner if needed    Butch Penny, MSN, NP-C 08/18/2022, 9:54 AM [facility] [address] [place], [place] [address] [phone]

## 2022-08-18 NOTE — Patient Instructions (Signed)
 Continue using CPAP nightly and greater than 4 hours each night °If your symptoms worsen or you develop new symptoms please let us know.  ° °

## 2022-08-31 ENCOUNTER — Ambulatory Visit: Payer: BC Managed Care – PPO | Admitting: Neurology

## 2022-09-01 DIAGNOSIS — G4733 Obstructive sleep apnea (adult) (pediatric): Secondary | ICD-10-CM | POA: Diagnosis not present

## 2022-10-09 DIAGNOSIS — G4733 Obstructive sleep apnea (adult) (pediatric): Secondary | ICD-10-CM | POA: Diagnosis not present

## 2022-12-03 DIAGNOSIS — G4733 Obstructive sleep apnea (adult) (pediatric): Secondary | ICD-10-CM | POA: Diagnosis not present

## 2022-12-22 ENCOUNTER — Other Ambulatory Visit (HOSPITAL_COMMUNITY): Payer: Self-pay

## 2022-12-22 MED ORDER — SEMAGLUTIDE-WEIGHT MANAGEMENT 0.25 MG/0.5ML ~~LOC~~ SOAJ
0.2500 mg | SUBCUTANEOUS | 3 refills | Status: DC
  Filled 2022-12-22 (×2): qty 2, 28d supply, fill #0
  Filled 2023-01-10 – 2023-01-14 (×2): qty 2, 28d supply, fill #1

## 2023-01-08 DIAGNOSIS — G4733 Obstructive sleep apnea (adult) (pediatric): Secondary | ICD-10-CM | POA: Diagnosis not present

## 2023-01-10 ENCOUNTER — Other Ambulatory Visit (HOSPITAL_COMMUNITY): Payer: Self-pay

## 2023-01-14 ENCOUNTER — Encounter: Payer: Self-pay | Admitting: Internal Medicine

## 2023-01-17 ENCOUNTER — Other Ambulatory Visit: Payer: Self-pay | Admitting: Internal Medicine

## 2023-01-17 ENCOUNTER — Ambulatory Visit (INDEPENDENT_AMBULATORY_CARE_PROVIDER_SITE_OTHER): Payer: BC Managed Care – PPO | Admitting: Internal Medicine

## 2023-01-17 VITALS — BP 108/72 | HR 80 | Temp 98.7°F | Ht 60.0 in | Wt 194.0 lb

## 2023-01-17 DIAGNOSIS — L739 Follicular disorder, unspecified: Secondary | ICD-10-CM

## 2023-01-17 MED ORDER — AMOXICILLIN-POT CLAVULANATE 875-125 MG PO TABS: 1.0000 | ORAL_TABLET | Freq: Two times a day (BID) | ORAL | 1 refills | Status: DC

## 2023-01-17 NOTE — Progress Notes (Signed)
Subjective:    Patient ID: Rodney Johnston, male    DOB: 01-17-2001, 22 y.o.   MRN: [id number]      HPI Rodney Johnston is here for  Chief Complaint  Patient presents with   Rash    Under left arm and groin area   He is here with his father.  Has a history of folliculitis that is recurrent.  Has developed a rash under his left arm and upper chest that is typical for his folliculitis.  He has seen dermatology for this in the past.  He has tried topical medications, but typically only oral antibiotics are effective.  His parents are hoping to get this controlled before he gets worse.      Medications and allergies reviewed with patient and updated if appropriate.  Current Outpatient Medications on File Prior to Visit  Medication Sig Dispense Refill   cetirizine (ZYRTEC) 10 MG tablet Take 10 mg by mouth as needed for allergies (during SPRING months ONLY).     Cholecalciferol (VITAMIN D-1000 MAX ST) 25 MCG (1000 UT) tablet Take 1,000 Units by mouth in the morning.     ciprofloxacin-dexamethasone (CIPRODEX) OTIC suspension Place 4 drops into both ears 2 (two) times daily as needed (ear infections).     fluticasone (CUTIVATE) 0.05 % cream Apply topically 2 (two) times daily.     fluticasone (FLONASE) 50 MCG/ACT nasal spray Place 1 spray into the nose daily as needed for allergies (during SPRING months ONLY).     hydrocortisone valerate cream (WESTCORT) 0.2 % Apply 1 Application topically 2 (two) times daily.     hydrocortisone valerate cream (WESTCORT) 0.2 % Apply 1 Application topically 2 (two) times daily.     ibuprofen (ADVIL) 200 MG tablet Take 400 mg by mouth every 8 (eight) hours as needed (for pain.).     ketoconazole (NIZORAL) 2 % cream Apply 1 Application topically daily.     levothyroxine (SYNTHROID, LEVOTHROID) 25 MCG tablet Take 25-37.5 mcg by mouth See admin instructions. Take 1 tablet (25 mcg) by mouth every other day--alternating with taking 1.5 tablets (37.5 mcg) by mouth  every other day cyclically repeating.     Lisdexamfetamine Dimesylate 40 MG CHEW Chew by mouth.     mupirocin cream (BACTROBAN) 2 % Apply 1 Application topically 2 (two) times daily.     nystatin powder Apply 1 Application topically 3 (three) times daily.     pantoprazole (PROTONIX) 40 MG tablet Take 1 tablet 30 minutes before lunch (Patient taking differently: as needed. Take 1 tablet 30 minutes before lunch) 30 tablet 2   polyethylene glycol (MIRALAX / GLYCOLAX) packet Take 8.5 g by mouth in the morning.     Semaglutide-Weight Management 0.25 MG/0.5ML SOAJ Inject 0.25 mg into the skin once a week. 2 mL 3   No current facility-administered medications on file prior to visit.    Review of Systems     Objective:   Vitals:   01/17/23 1552  BP: 108/72  Pulse: 80  Temp: 98.7 F (37.1 C)  SpO2: 94%   BP Readings from Last 3 Encounters:  01/17/23 108/72  08/18/22 113/67  07/22/22 100/60   Wt Readings from Last 3 Encounters:  01/17/23 194 lb (88 kg)  07/22/22 184 lb 3.2 oz (83.6 kg)  10/20/21 179 lb (81.2 kg)   Body mass index is 37.89 kg/m.    Physical Exam Constitutional:      General: He is not in acute distress.    Appearance:  Normal appearance. He is not ill-appearing.  HENT:     Head: Normocephalic and atraumatic.  Skin:    General: Skin is warm and dry.     Findings: Rash (Left axilla and left upper chest-volar follicular like rash without base erythema, no open wounds) present.  Neurological:     Mental Status: He is alert.            Assessment & Plan:    See Problem List for Assessment and Plan of chronic medical problems.

## 2023-01-17 NOTE — Assessment & Plan Note (Signed)
Chronic Has intermittent flares or recurrence Using topical creams and ointments prescribed by dermatology Unfortunately it does require oral antibiotics to clear this up at times Start Augmentin 875-125 mg twice daily x 10 days Refill put on the prescription if so if he has another flare over the next 6 months he can take that

## 2023-01-17 NOTE — Patient Instructions (Addendum)
        Medications changes include :   Augmentin twice a day x 10 days      Return if symptoms worsen or fail to improve.

## 2023-01-19 ENCOUNTER — Other Ambulatory Visit (HOSPITAL_COMMUNITY): Payer: Self-pay

## 2023-01-19 DIAGNOSIS — E039 Hypothyroidism, unspecified: Secondary | ICD-10-CM | POA: Diagnosis not present

## 2023-01-19 DIAGNOSIS — R7309 Other abnormal glucose: Secondary | ICD-10-CM | POA: Diagnosis not present

## 2023-01-19 DIAGNOSIS — Q909 Down syndrome, unspecified: Secondary | ICD-10-CM | POA: Diagnosis not present

## 2023-01-19 DIAGNOSIS — E559 Vitamin D deficiency, unspecified: Secondary | ICD-10-CM | POA: Diagnosis not present

## 2023-01-19 DIAGNOSIS — K900 Celiac disease: Secondary | ICD-10-CM | POA: Diagnosis not present

## 2023-01-19 DIAGNOSIS — R7303 Prediabetes: Secondary | ICD-10-CM | POA: Diagnosis not present

## 2023-01-19 DIAGNOSIS — H5021 Vertical strabismus, right eye: Secondary | ICD-10-CM | POA: Diagnosis not present

## 2023-01-19 DIAGNOSIS — H5501 Congenital nystagmus: Secondary | ICD-10-CM | POA: Diagnosis not present

## 2023-01-19 DIAGNOSIS — R632 Polyphagia: Secondary | ICD-10-CM | POA: Diagnosis not present

## 2023-01-19 DIAGNOSIS — G4730 Sleep apnea, unspecified: Secondary | ICD-10-CM | POA: Diagnosis not present

## 2023-01-19 DIAGNOSIS — Z79890 Hormone replacement therapy (postmenopausal): Secondary | ICD-10-CM | POA: Diagnosis not present

## 2023-01-19 DIAGNOSIS — H52223 Regular astigmatism, bilateral: Secondary | ICD-10-CM | POA: Diagnosis not present

## 2023-01-19 MED ORDER — WEGOVY 0.5 MG/0.5ML ~~LOC~~ SOAJ
0.5000 mL | SUBCUTANEOUS | 11 refills | Status: DC
  Filled 2023-01-19: qty 2, 28d supply, fill #0
  Filled 2023-02-05 – 2023-02-11 (×2): qty 2, 28d supply, fill #1
  Filled 2023-03-01 – 2023-03-11 (×3): qty 2, 28d supply, fill #2

## 2023-01-20 ENCOUNTER — Other Ambulatory Visit (HOSPITAL_COMMUNITY): Payer: Self-pay

## 2023-01-23 DIAGNOSIS — G4733 Obstructive sleep apnea (adult) (pediatric): Secondary | ICD-10-CM | POA: Diagnosis not present

## 2023-02-05 ENCOUNTER — Other Ambulatory Visit (HOSPITAL_COMMUNITY): Payer: Self-pay

## 2023-02-14 ENCOUNTER — Other Ambulatory Visit (HOSPITAL_COMMUNITY): Payer: Self-pay

## 2023-02-14 ENCOUNTER — Encounter: Payer: Self-pay | Admitting: Internal Medicine

## 2023-02-14 DIAGNOSIS — R7303 Prediabetes: Secondary | ICD-10-CM

## 2023-02-14 DIAGNOSIS — E039 Hypothyroidism, unspecified: Secondary | ICD-10-CM

## 2023-02-16 DIAGNOSIS — E669 Obesity, unspecified: Secondary | ICD-10-CM | POA: Insufficient documentation

## 2023-02-23 DIAGNOSIS — G4733 Obstructive sleep apnea (adult) (pediatric): Secondary | ICD-10-CM | POA: Diagnosis not present

## 2023-03-01 ENCOUNTER — Other Ambulatory Visit (HOSPITAL_COMMUNITY): Payer: Self-pay

## 2023-03-02 ENCOUNTER — Other Ambulatory Visit (HOSPITAL_COMMUNITY): Payer: Self-pay

## 2023-03-17 ENCOUNTER — Telehealth: Payer: Self-pay | Admitting: Adult Health

## 2023-03-17 NOTE — Telephone Encounter (Signed)
Reschedule appointment

## 2023-03-26 DIAGNOSIS — G4733 Obstructive sleep apnea (adult) (pediatric): Secondary | ICD-10-CM | POA: Diagnosis not present

## 2023-04-01 ENCOUNTER — Other Ambulatory Visit (HOSPITAL_COMMUNITY): Payer: Self-pay

## 2023-04-01 MED ORDER — WEGOVY 1 MG/0.5ML ~~LOC~~ SOAJ
1.0000 mg | SUBCUTANEOUS | 11 refills | Status: DC
  Filled 2023-04-01: qty 2, 28d supply, fill #0
  Filled 2023-04-24: qty 2, 28d supply, fill #1
  Filled 2023-05-26: qty 2, 28d supply, fill #2

## 2023-04-11 ENCOUNTER — Other Ambulatory Visit (HOSPITAL_COMMUNITY): Payer: Self-pay

## 2023-05-06 DIAGNOSIS — G4733 Obstructive sleep apnea (adult) (pediatric): Secondary | ICD-10-CM | POA: Diagnosis not present

## 2023-05-31 ENCOUNTER — Ambulatory Visit (INDEPENDENT_AMBULATORY_CARE_PROVIDER_SITE_OTHER): Payer: BC Managed Care – PPO | Admitting: Internal Medicine

## 2023-05-31 ENCOUNTER — Other Ambulatory Visit (HOSPITAL_COMMUNITY): Payer: Self-pay

## 2023-05-31 ENCOUNTER — Encounter: Payer: Self-pay | Admitting: Internal Medicine

## 2023-05-31 VITALS — BP 110/72 | HR 85 | Ht 60.0 in | Wt 192.0 lb

## 2023-05-31 DIAGNOSIS — E039 Hypothyroidism, unspecified: Secondary | ICD-10-CM | POA: Diagnosis not present

## 2023-05-31 DIAGNOSIS — R7303 Prediabetes: Secondary | ICD-10-CM

## 2023-05-31 DIAGNOSIS — F50819 Binge eating disorder, unspecified: Secondary | ICD-10-CM | POA: Diagnosis not present

## 2023-05-31 LAB — POCT GLYCOSYLATED HEMOGLOBIN (HGB A1C): Hemoglobin A1C: 5.3 % (ref 4.0–5.6)

## 2023-05-31 MED ORDER — WEGOVY 1.7 MG/0.75ML ~~LOC~~ SOAJ
1.7000 mg | SUBCUTANEOUS | 3 refills | Status: DC
Start: 2023-05-31 — End: 2023-07-29
  Filled 2023-05-31: qty 3, 28d supply, fill #0
  Filled 2023-06-22: qty 3, 28d supply, fill #1

## 2023-05-31 MED ORDER — WEGOVY 1.7 MG/0.75ML ~~LOC~~ SOAJ: 1.7000 mg | SUBCUTANEOUS | 3 refills | Status: DC

## 2023-05-31 MED ORDER — DEXAMETHASONE 1 MG PO TABS
1.0000 mg | ORAL_TABLET | Freq: Once | ORAL | 0 refills | Status: AC
Start: 2023-05-31 — End: 2023-05-31

## 2023-05-31 NOTE — Patient Instructions (Signed)
   Instructions for Dexamethasone Suppression Test   Step 1: Choose a morning when you can come to our lab at 8:00 am for a blood draw.   Step 2: On the night before the blood draw, take one 1 mg tablet of dexamethasone at 11:30 pm.  The timing is VERY important!   Step 3: The next morning, go to the lab for blood work at 8:00 am.  Bonita Quin do not have to be on an empty stomach, but the timing is VERY important!

## 2023-05-31 NOTE — Progress Notes (Unsigned)
 Name: Rodney Johnston  MRN/ DOB: [id number], 2000/10/04    Age/ Sex: 23 y.o., male    PCP: Colene Dauphin, MD   Reason for Endocrinology Evaluation: Hypothyroidism     Date of Initial Endocrinology Evaluation: 05/31/2023     HPI: [name] is a 23 y.o. male with a past medical history of prediabetes, vitamin D deficiency, hypothyroid and hyperphagia, celiac disease, Down syndrome, OSA. The patient presented for initial endocrinology clinic visit on 05/31/2023 for consultative assistance with his Hypothyroidism.    Patient was following up with [facility] for vitamin D deficiency, prediabetes, hypothyroid, hyperphagia, and celiac disease   THYROID  HISTORY: Patient was evaluated by peds endocrinology in 2009, he was started on levothyroxine Anti-TPO antibodies negative  No Fh of thyroid  disease   PREDIABETES: His A1c was 5.8% in 01/2020 Diabetes autoantibodies have been negative through [facility] peds endocrinology, including gad 65 antibodies, insulin  antibodies, IA-2 antibodies, ZNT8 antibodies     HYPERPHAGIA: Patient has been on long-term Vyvanse through peds endocrinology for hyperphagia/binge eating disorder. He was eventually switched from Vyvanse to Wegovy  due to lack of clinical response to Vyvanse   He is accompanied by his mother  Patient follows with neurology for OSA, on CPAP   Denies local neck swelling  Denies palpitations  Has occasional constipation and takes Miralax daily  Denies hand tremors He goes to [employer] in [employer] , hospitality certificate  He does game, has an elliptical bike at home He mostly avoids sugar sweetened beverages unless he is eating out   MEDICATIONS Levothyroxine 37.5 mcg 3 days a week +25 mcg 4 days a week Vitamin D3 1000 units daily Wegovy  1 mg weekly  Gluten-free diet   HISTORY:  Past Medical History:  Past Medical History:  Diagnosis Date   Anesthesia complication    mother  states difficulty maintaining airway under conscious sedation previously; was told to never undergo a procedure without artificial airway maintenance   Celiac disease    Complication of anesthesia 2009   mother states difficulty maintaining airway under conscious sedation previously; was told to never undergo a procedure without artificial airway maintenance, trouble during EGD at [facility]   Down syndrome    c-spine films done 09/13/2012: no atlantoaxial instability   Family history of adverse reaction to anesthesia    mother and pt brother PONV   History of asthma    no treatment since 2010   Hypothyroid    Impacted teeth with abnormal position 10/02/2013   wisdom teeth   Loose joints    due to Down syndrome   Low muscle tone    Rash, skin 10/11/2013   multiple sites   Seasonal allergies    Sensitive skin    Sleep apnea    per mother severe, uses CPAP as much as he can tolerate   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   Vitamin D deficiency    Past Surgical History:  Past Surgical History:  Procedure Laterality Date   BALLOON DILATION N/A 10/20/2021   Procedure: BALLOON DILATION;  Surgeon: Elois Hair, MD;  Location: [facility];  Service: Gastroenterology;  Laterality: N/A;   BIOPSY  10/20/2021   Procedure: BIOPSY;  Surgeon: Elois Hair, MD;  Location: [facility];  Service: Gastroenterology;;   DUODENAL ATRESIA REPAIR  January 03, 2000   ESOPHAGOGASTRODUODENOSCOPY (EGD) WITH PROPOFOL  N/A 10/20/2021   Procedure: ESOPHAGOGASTRODUODENOSCOPY (EGD) WITH PROPOFOL ;  Surgeon: Elois Hair, MD;  Location: [facility];  Service: Gastroenterology;  Laterality: N/A;   EXPLORATORY LAPAROTOMY  07-14-1999   HEMANGIOMA EXCISION  2012   from eye   MASTOIDECTOMY     MULTIPLE TOOTH EXTRACTIONS  10/02/2009   #R,Q,N,M   STRABISMUS SURGERY  2010, 2011   TEAR DUCT PROBING  2002   TONSILLECTOMY AND ADENOIDECTOMY  02/23/2001   TOOTH EXTRACTION  09/23/2011   Procedure: EXTRACTION  MOLARS;  Surgeon: Julien Odor, DDS;  Location: [facility];  Service: Oral Surgery;  Laterality: N/A;  surgical removal of teeth A,B,C,H,J,K,T   TOOTH EXTRACTION N/A 10/17/2013   Procedure: EXTRACTION TEETH #17, 21, 28, 32, REMOVAL OF ODONTOGENIC TUMOR LEFT MANDIBLE, EXPOSE TOOTH #15 AND LABIAL FRENECTOMY    ;  Surgeon: Julien Odor, DDS;  Location: [facility];  Service: Oral Surgery;  Laterality: N/A;   TYMPANOSTOMY TUBE CHANGE W/ MLB  10/02/2009; 02/23/2001   revision right ear myringotomy; remove and replace left ear tube   TYMPANOSTOMY TUBE PLACEMENT  04/18/2000   UPPER GI ENDOSCOPY  2007    Social History:  reports that he has never smoked. He has never used smokeless tobacco. He reports that he does not drink alcohol and does not use drugs. Family History: family history includes Alcohol abuse in his maternal grandfather; Arthritis in his maternal grandmother; Asthma in his paternal grandfather; Diabetes in his maternal grandmother; Hearing loss in his brother; Hypertension in his maternal grandmother; Irritable bowel syndrome in his mother; Sleep apnea in his maternal aunt and maternal uncle.   HOME MEDICATIONS: Allergies as of 05/31/2023       Reactions   Gluten Meal Other (See Comments)   Other Reaction: GI Upset-Celiac disease Other Reaction: GI Upset-Celiac disease   Wheat Other (See Comments)   CELIAC DISEASE        Medication List        Accurate as of May 31, 2023  2:01 PM. If you have any questions, ask your nurse or doctor.          STOP taking these medications    amoxicillin -clavulanate 875-125 MG tablet Commonly known as: AUGMENTIN  Stopped by: Camilla Cedar Azad Calame   ciprofloxacin-dexamethasone  OTIC suspension Commonly known as: CIPRODEX Stopped by: Camilla Cedar Janthony Holleman   Wegovy  0.25 MG/0.5ML Soaj Generic drug: Semaglutide -Weight Management Replaced by: Wegovy  1.7 MG/0.75ML Soaj Stopped by: Camilla Cedar Conor Lata    Wegovy  1 MG/0.5ML Soaj Generic drug: Semaglutide -Weight Management Stopped by: Camilla Cedar Leonna Schlee       TAKE these medications    cetirizine 10 MG tablet Commonly known as: ZYRTEC Take 10 mg by mouth as needed for allergies (during SPRING months ONLY).   dexamethasone  1 MG tablet Commonly known as: DECADRON  Take 1 tablet (1 mg total) by mouth once for 1 dose. Started by: Koran Seabrook J Divit Stipp   fluticasone 0.05 % cream Commonly known as: CUTIVATE Apply topically 2 (two) times daily.   fluticasone 50 MCG/ACT nasal spray Commonly known as: FLONASE Place 1 spray into the nose daily as needed for allergies (during SPRING months ONLY).   hydrocortisone valerate cream 0.2 % Commonly known as: WESTCORT Apply 1 Application topically 2 (two) times daily.   hydrocortisone valerate cream 0.2 % Commonly known as: WESTCORT Apply 1 Application topically 2 (two) times daily.   ibuprofen 200 MG tablet Commonly known as: ADVIL Take 400 mg by mouth every 8 (eight) hours as needed (for pain.).   ipratropium 0.06 % nasal spray Commonly known as: ATROVENT Place into both  nostrils.   ketoconazole 2 % cream Commonly known as: NIZORAL Apply 1 Application topically daily.   levothyroxine 25 MCG tablet Commonly known as: SYNTHROID Take 25-37.5 mcg by mouth See admin instructions. Take 1 tablet (25 mcg) by mouth every other day--alternating with taking 1.5 tablets (37.5 mcg) by mouth every other day cyclically repeating.   Lisdexamfetamine Dimesylate 40 MG Chew Chew by mouth.   mupirocin cream 2 % Commonly known as: BACTROBAN Apply 1 Application topically 2 (two) times daily.   nystatin powder Apply 1 Application topically 3 (three) times daily.   pantoprazole  40 MG tablet Commonly known as: PROTONIX  Take 1 tablet 30 minutes before lunch What changed:  when to take this reasons to take this   polyethylene glycol 17 g packet Commonly known as: MIRALAX / GLYCOLAX Take 8.5 g  by mouth in the morning.   Vitamin D-1000 Max St 25 MCG (1000 UT) tablet Generic drug: Cholecalciferol Take 1,000 Units by mouth in the morning.   Wegovy  1.7 MG/0.75ML Soaj Generic drug: Semaglutide -Weight Management Inject 1.7 mg into the skin once a week. Replaces: Wegovy  0.25 MG/0.5ML Soaj Started by: Camilla Cedar Juanmanuel Marohl          REVIEW OF SYSTEMS: A comprehensive ROS was conducted with the patient and is negative except as per HPI    OBJECTIVE:  VS: BP 110/72 (BP Location: Left Arm, Patient Position: Sitting, Cuff Size: Normal)   Pulse 85   Ht 5' (1.524 m)   Wt 192 lb (87.1 kg)   SpO2 95%   BMI 37.50 kg/m    Wt Readings from Last 3 Encounters:  05/31/23 192 lb (87.1 kg)  01/17/23 194 lb (88 kg)  07/22/22 184 lb 3.2 oz (83.6 kg)     EXAM: General: Pt appears well and is in NAD  Eyes: External eye exam normal without stare, lid lag or exophthalmos.  EOM intact.  PERRL.  Neck: General: Supple without adenopathy. Thyroid : Thyroid  size normal.  No goiter or nodules appreciated.  Lungs: Clear with good BS bilat   Heart: Auscultation: RRR.  Abdomen: Soft, nontender, patient with multiple violaceous abdominal wall striae  Extremities:  BL LE: No pretibial edema   Mental Status: Judgment, insight: Intact Orientation: Oriented to time, place, and person Mood and affect: No depression, anxiety, or agitation     DATA REVIEWED: ***    Latest Ref Rng 01/13/2022  Diabetes Interpretation, S See Comments  GAD65 Ab Assay, S (BKRREF) <=0.02 nmol/L 0.00  Insulin  Antibodies 0.00 - 0.02 nmol/L 0.00  IA-2 Ab, S <=0.02 nmol/L 0.00  ZnT8 Ab, S <15.0 U/mL <15.0    ASSESSMENT/PLAN/RECOMMENDATIONS:   Hypothyroidism   - Patient is clinically euthyroid - No local neck symptoms - Pt educated extensively on the correct way to take levothyroxine (first thing in the morning with water, 30 minutes before eating or taking other medications). - Pt encouraged to double dose  the following day if she were to miss a dose given long half-life of levothyroxine.   Medications : Levothyroxine 25 mcg, 1.5 tablets 3 days a week and 1 tablet rest of the week  2. Pre-diabetes :  -This has normalized with an A1c of 5.3% - This has been attributed to being on Wegovy  - Encouraged avoiding sugar sweetened beverages, patient does exercise  3. Obesity/Bing eating disorder :  -Vyvanse was ineffective - He is currently on Wegovy , there has been no weight loss but no weight gain either - Patient has been noted with cushingoid  features to include weight gain, prediabetes, and abdominal wall striae, we will proceed with dexamethasone  suppression test   Follow-up in 6 months  Signed electronically by: Natale Bail, MD  [facility]  [facility] [address] [place], [place] [address] Phone: [phone] FAX: [phone]   CC: Colene Dauphin, MD [address] [place] [address] Phone: [phone] Fax: [phone]   Return to Endocrinology clinic as below: Future Appointments  Date Time Provider Department Center  07/25/2023 10:40 AM Colene Dauphin, MD [facility] None  08/31/2023  8:30 AM Clem Currier, NP [facility] None

## 2023-06-01 ENCOUNTER — Encounter: Payer: Self-pay | Admitting: Internal Medicine

## 2023-06-01 MED ORDER — LEVOTHYROXINE SODIUM 25 MCG PO TABS: 25.0000 ug | ORAL_TABLET | ORAL | 3 refills | Status: DC

## 2023-06-02 LAB — ACTH: C206 ACTH: 27 pg/mL (ref 6–50)

## 2023-06-02 LAB — TSH: TSH: 1.81 m[IU]/L (ref 0.40–4.50)

## 2023-06-02 LAB — T4, FREE: Free T4: 1.3 ng/dL (ref 0.8–1.8)

## 2023-06-02 LAB — CORTISOL: Cortisol, Plasma: 21.1 ug/dL

## 2023-06-09 ENCOUNTER — Other Ambulatory Visit

## 2023-06-10 ENCOUNTER — Other Ambulatory Visit

## 2023-06-10 DIAGNOSIS — F50819 Binge eating disorder, unspecified: Secondary | ICD-10-CM | POA: Diagnosis not present

## 2023-06-14 ENCOUNTER — Ambulatory Visit: Payer: Self-pay | Admitting: Internal Medicine

## 2023-06-17 LAB — DEXAMETHASONE, BLOOD: Dexamethasone, Serum: 251 ng/dL

## 2023-06-17 LAB — CORTISOL: Cortisol, Plasma: 0.8 ug/dL — ABNORMAL LOW

## 2023-06-22 DIAGNOSIS — G4733 Obstructive sleep apnea (adult) (pediatric): Secondary | ICD-10-CM | POA: Diagnosis not present

## 2023-06-23 ENCOUNTER — Other Ambulatory Visit (HOSPITAL_COMMUNITY): Payer: Self-pay

## 2023-07-15 ENCOUNTER — Other Ambulatory Visit (HOSPITAL_COMMUNITY): Payer: Self-pay

## 2023-07-15 MED ORDER — TIRZEPATIDE-WEIGHT MANAGEMENT 5 MG/0.5ML ~~LOC~~ SOAJ
5.0000 mg | SUBCUTANEOUS | 3 refills | Status: DC
  Filled 2023-07-15: qty 2, 28d supply, fill #0
  Filled 2023-08-10: qty 2, 28d supply, fill #1
  Filled 2023-08-29: qty 2, 28d supply, fill #2

## 2023-07-18 ENCOUNTER — Telehealth: Payer: Self-pay

## 2023-07-18 ENCOUNTER — Other Ambulatory Visit (HOSPITAL_COMMUNITY): Payer: Self-pay

## 2023-07-18 NOTE — Telephone Encounter (Signed)
 Pharmacy Patient Advocate Encounter   Received notification from CoverMyMeds that prior authorization for Zepbound 5MG /0.5ML pen-injectors is required/requested.   Insurance verification completed.   The patient is insured through [employer] .   Per test claim: PA required and submitted KEY/EOC/Request #: [id number] from 06/18/23 to 03/14/24. Ran test claim, Copay is $24.99. This test claim was processed through [facility]- copay amounts may vary at other pharmacies due to pharmacy/plan contracts, or as the patient moves through the different stages of their insurance plan.

## 2023-07-19 ENCOUNTER — Other Ambulatory Visit (HOSPITAL_COMMUNITY): Payer: Self-pay

## 2023-07-23 DIAGNOSIS — G4733 Obstructive sleep apnea (adult) (pediatric): Secondary | ICD-10-CM | POA: Diagnosis not present

## 2023-07-25 ENCOUNTER — Encounter: Payer: BC Managed Care – PPO | Admitting: Internal Medicine

## 2023-07-25 ENCOUNTER — Encounter: Payer: Self-pay | Admitting: Internal Medicine

## 2023-07-25 NOTE — Patient Instructions (Addendum)
 Tetanus vaccine today      Medications changes include :   try Hibiclens wash      Return in about 1 year (around 07/28/2024) for Physical Exam.    Health Maintenance, Male Adopting a healthy lifestyle and getting preventive care are important in promoting health and wellness. Ask your health care provider about: The right schedule for you to have regular tests and exams. Things you can do on your own to prevent diseases and keep yourself healthy. What should I know about diet, weight, and exercise? Eat a healthy diet  Eat a diet that includes plenty of vegetables, fruits, low-fat dairy products, and lean protein. Do not eat a lot of foods that are high in solid fats, added sugars, or sodium. Maintain a healthy weight Body mass index (BMI) is a measurement that can be used to identify possible weight problems. It estimates body fat based on height and weight. Your health care provider can help determine your BMI and help you achieve or maintain a healthy weight. Get regular exercise Get regular exercise. This is one of the most important things you can do for your health. Most adults should: Exercise for at least 150 minutes each week. The exercise should increase your heart rate and make you sweat (moderate-intensity exercise). Do strengthening exercises at least twice a week. This is in addition to the moderate-intensity exercise. Spend less time sitting. Even light physical activity can be beneficial. Watch cholesterol and blood lipids Have your blood tested for lipids and cholesterol at 24 years of age, then have this test every 5 years. You may need to have your cholesterol levels checked more often if: Your lipid or cholesterol levels are high. You are older than 24 years of age. You are at high risk for heart disease. What should I know about cancer screening? Many types of cancers can be detected early and may often be prevented. Depending on your health history and  family history, you may need to have cancer screening at various ages. This may include screening for: Colorectal cancer. Prostate cancer. Skin cancer. Lung cancer. What should I know about heart disease, diabetes, and high blood pressure? Blood pressure and heart disease High blood pressure causes heart disease and increases the risk of stroke. This is more likely to develop in people who have high blood pressure readings or are overweight. Talk with your health care provider about your target blood pressure readings. Have your blood pressure checked: Every 3-5 years if you are 49-59 years of age. Every year if you are 96 years old or older. If you are between the ages of 64 and 31 and are a current or former smoker, ask your health care provider if you should have a one-time screening for abdominal aortic aneurysm (AAA). Diabetes Have regular diabetes screenings. This checks your fasting blood sugar level. Have the screening done: Once every three years after age 57 if you are at a normal weight and have a low risk for diabetes. More often and at a younger age if you are overweight or have a high risk for diabetes. What should I know about preventing infection? Hepatitis B If you have a higher risk for hepatitis B, you should be screened for this virus. Talk with your health care provider to find out if you are at risk for hepatitis B infection. Hepatitis C Blood testing is recommended for: Everyone born from [birth year] through 1966. Anyone with known risk factors for hepatitis C. Sexually  transmitted infections (STIs) You should be screened each year for STIs, including gonorrhea and chlamydia, if: You are sexually active and are younger than 24 years of age. You are older than 24 years of age and your health care provider tells you that you are at risk for this type of infection. Your sexual activity has changed since you were last screened, and you are at increased risk for chlamydia or  gonorrhea. Ask your health care provider if you are at risk. Ask your health care provider about whether you are at high risk for HIV. Your health care provider may recommend a prescription medicine to help prevent HIV infection. If you choose to take medicine to prevent HIV, you should first get tested for HIV. You should then be tested every 3 months for as long as you are taking the medicine. Follow these instructions at home: Alcohol use Do not drink alcohol if your health care provider tells you not to drink. If you drink alcohol: Limit how much you have to 0-2 drinks a day. Know how much alcohol is in your drink. In the [place] one drink equals one 12 oz bottle of beer (355 mL), one 5 oz glass of wine (148 mL), or one 1 oz glass of hard liquor (44 mL). Lifestyle Do not use any products that contain nicotine or tobacco. These products include cigarettes, chewing tobacco, and vaping devices, such as e-cigarettes. If you need help quitting, ask your health care provider. Do not use street drugs. Do not share needles. Ask your health care provider for help if you need support or information about quitting drugs. General instructions Schedule regular health, dental, and eye exams. Stay current with your vaccines. Tell your health care provider if: You often feel depressed. You have ever been abused or do not feel safe at home. Summary Adopting a healthy lifestyle and getting preventive care are important in promoting health and wellness. Follow your health care provider's instructions about healthy diet, exercising, and getting tested or screened for diseases. Follow your health care provider's instructions on monitoring your cholesterol and blood pressure. This information is not intended to replace advice given to you by your health care provider. Make sure you discuss any questions you have with your health care provider. Document Revised: 06/09/2020 Document Reviewed: 06/09/2020 Elsevier  Patient Education  2024 [employer].

## 2023-07-25 NOTE — Progress Notes (Signed)
 Subjective:    Patient ID: Rodney Johnston, male    DOB: 06-28-2000, 23 y.o.   MRN: [id number]     HPI Rodney Johnston is here for a physical exam and his chronic medical problems.   Overall doing well.  Has 1 year left of school.  Will be living by himself in a condo this next year.  Still battling some folliculitis which is intermittent.   Medications and allergies reviewed with patient and updated if appropriate.  Current Outpatient Medications on File Prior to Visit  Medication Sig Dispense Refill   cetirizine (ZYRTEC) 10 MG tablet Take 10 mg by mouth as needed for allergies (during SPRING months ONLY).     Cholecalciferol (VITAMIN D-1000 MAX ST) 25 MCG (1000 UT) tablet Take 1,000 Units by mouth in the morning.     fluticasone (CUTIVATE) 0.05 % cream Apply topically 2 (two) times daily.     fluticasone (FLONASE) 50 MCG/ACT nasal spray Place 1 spray into the nose daily as needed for allergies (during SPRING months ONLY).     hydrocortisone valerate cream (WESTCORT) 0.2 % Apply 1 Application topically 2 (two) times daily.     hydrocortisone valerate cream (WESTCORT) 0.2 % Apply 1 Application topically 2 (two) times daily.     ibuprofen (ADVIL) 200 MG tablet Take 400 mg by mouth every 8 (eight) hours as needed (for pain.).     ipratropium (ATROVENT) 0.06 % nasal spray Place into both nostrils.     ketoconazole (NIZORAL) 2 % cream Apply 1 Application topically daily.     levothyroxine  (SYNTHROID ) 25 MCG tablet Take 1 tablet (25 mcg total) by mouth See admin instructions. 1.5 tabs 3 days a week and 1 tab rest of the week 110 tablet 3   Lisdexamfetamine Dimesylate 40 MG CHEW Chew by mouth.     mupirocin cream (BACTROBAN) 2 % Apply 1 Application topically 2 (two) times daily.     nystatin powder Apply 1 Application topically 3 (three) times daily.     pantoprazole  (PROTONIX ) 40 MG tablet Take 1 tablet 30 minutes before lunch (Patient taking differently: as needed. Take 1 tablet 30 minutes  before lunch) 30 tablet 2   polyethylene glycol (MIRALAX / GLYCOLAX) packet Take 8.5 g by mouth in the morning.     tirzepatide  (ZEPBOUND ) 5 MG/0.5ML Pen Inject 5 mg into the skin once a week. 6 mL 3   mupirocin ointment (BACTROBAN) 2 % 1 Application.     No current facility-administered medications on file prior to visit.    Review of Systems  Constitutional:  Negative for fever.  Eyes:  Positive for visual disturbance (from nystagmus).  Respiratory:  Positive for cough (chronic). Negative for shortness of breath and wheezing.   Cardiovascular:  Negative for chest pain, palpitations and leg swelling.  Gastrointestinal:  Positive for constipation. Negative for abdominal pain, blood in stool and diarrhea.       No gerd  Genitourinary:  Negative for difficulty urinating.  Musculoskeletal:  Positive for arthralgias (lower back sometimes). Negative for back pain.  Skin:  Positive for rash (b/l axilla - yeast).  Neurological:  Positive for headaches (rare - when does not wear cpap). Negative for light-headedness.  Psychiatric/Behavioral:  Negative for dysphoric mood. The patient is not nervous/anxious.        Objective:   Vitals:   07/29/23 0857  BP: 112/76  Pulse: 63  Temp: 98.2 F (36.8 C)  SpO2: 93%   Filed Weights   07/29/23 0857  Weight: 189 lb (85.7 kg)   Body mass index is 36.91 kg/m.  BP Readings from Last 3 Encounters:  07/29/23 112/76  05/31/23 110/72  01/17/23 108/72    Wt Readings from Last 3 Encounters:  07/29/23 189 lb (85.7 kg)  05/31/23 192 lb (87.1 kg)  01/17/23 194 lb (88 kg)      Physical Exam Constitutional: He appears well-developed and well-nourished. No distress.  HENT:  Head: Normocephalic and atraumatic.  Right Ear: External ear normal.  Left Ear: External ear normal.  Normal ear canals and TM b/l  Mouth/Throat: Oropharynx is clear and moist. Eyes: Conjunctivae and EOM are normal.  Neck: Neck supple. No tracheal deviation present. No  thyromegaly present.  No carotid bruit  Cardiovascular: Normal rate, regular rhythm, normal heart sounds and intact distal pulses.   No murmur heard.  No lower extremity edema. Pulmonary/Chest: Effort normal and breath sounds normal. No respiratory distress. He has no wheezes. He has no rales.  Abdominal: Soft. He exhibits no distension. There is no tenderness.  Genitourinary: deferred  Lymphadenopathy:   He has no cervical adenopathy.  Skin: Skin is warm and dry. He is not diaphoretic.  Folliculitis, chest.  Tinea infection left axilla Psychiatric: He has a normal mood and affect. His behavior is normal.         Assessment & Plan:   Physical exam: Screening blood work  deferred Exercise   regular Weight  obese - working on weight loss Substance abuse   none   Reviewed recommended immunizations.  Tdap given today   Health Maintenance  Topic Date Due   HIV Screening  Never done   Hepatitis C Screening  Never done   DTaP/Tdap/Td (1 - Tdap) Never done   Hepatitis B Vaccines (1 of 3 - 19+ 3-dose series) Never done   COVID-19 Vaccine (3 - 2024-25 season) 08/10/2023 (Originally 10/03/2022)   INFLUENZA VACCINE  09/02/2023   HPV VACCINES  Completed   Meningococcal B Vaccine  Completed     See Problem List for Assessment and Plan of chronic medical problems.

## 2023-07-29 ENCOUNTER — Ambulatory Visit (INDEPENDENT_AMBULATORY_CARE_PROVIDER_SITE_OTHER): Payer: MEDICAID | Admitting: Internal Medicine

## 2023-07-29 VITALS — BP 112/76 | HR 63 | Temp 98.2°F | Ht 60.0 in | Wt 189.0 lb

## 2023-07-29 DIAGNOSIS — Z0000 Encounter for general adult medical examination without abnormal findings: Secondary | ICD-10-CM | POA: Diagnosis not present

## 2023-07-29 DIAGNOSIS — L739 Follicular disorder, unspecified: Secondary | ICD-10-CM | POA: Diagnosis not present

## 2023-07-29 DIAGNOSIS — E66812 Obesity, class 2: Secondary | ICD-10-CM

## 2023-07-29 DIAGNOSIS — E559 Vitamin D deficiency, unspecified: Secondary | ICD-10-CM | POA: Diagnosis not present

## 2023-07-29 DIAGNOSIS — G4730 Sleep apnea, unspecified: Secondary | ICD-10-CM

## 2023-07-29 DIAGNOSIS — B354 Tinea corporis: Secondary | ICD-10-CM | POA: Insufficient documentation

## 2023-07-29 DIAGNOSIS — Z23 Encounter for immunization: Secondary | ICD-10-CM | POA: Diagnosis not present

## 2023-07-29 DIAGNOSIS — K219 Gastro-esophageal reflux disease without esophagitis: Secondary | ICD-10-CM

## 2023-07-29 DIAGNOSIS — R7303 Prediabetes: Secondary | ICD-10-CM | POA: Diagnosis not present

## 2023-07-29 DIAGNOSIS — Z6837 Body mass index (BMI) 37.0-37.9, adult: Secondary | ICD-10-CM

## 2023-07-29 DIAGNOSIS — E039 Hypothyroidism, unspecified: Secondary | ICD-10-CM

## 2023-07-29 MED ORDER — FLUCONAZOLE 150 MG PO TABS: 150.0000 mg | ORAL_TABLET | Freq: Once | ORAL | 0 refills | Status: AC

## 2023-07-29 NOTE — Assessment & Plan Note (Signed)
Chronic Taking vitamin d daily Check vitamin D level

## 2023-07-29 NOTE — Assessment & Plan Note (Signed)
 Chronic Wearing cpap nightly when he is at home, but is less consistent at school He understands the importance of wearing this on a regular basis

## 2023-07-29 NOTE — Assessment & Plan Note (Addendum)
 Chronic Has intermittent flares or recurrence Using topical creams and ointments prescribed by dermatology Intermittently requires oral antibiotics to clear this up at times Advised Hibiclens wash-you can mix this in with his body wash that he likes

## 2023-07-29 NOTE — Assessment & Plan Note (Addendum)
 Chronic Lab Results  Component Value Date   HGBA1C 5.3 05/31/2023   Last A1c in normal range Zepbound  5 mg weekly Low sugar / carb diet Stressed regular exercise

## 2023-07-29 NOTE — Assessment & Plan Note (Signed)
 Chronic Currently on Zepbound  5 mg weekly which he is doing well with Management per endocrine

## 2023-07-29 NOTE — Assessment & Plan Note (Signed)
 Chronic Management per endocrine On levothyroxine 

## 2023-07-29 NOTE — Assessment & Plan Note (Signed)
 Acute Left axilla Will give fluconazole 150 mg p.o. x 1 He has a cream to use, but does not use it Advised to try an antifungal spray to see if that helps and is easier for him to use

## 2023-07-29 NOTE — Assessment & Plan Note (Signed)
Chronic Controlled Continue pantoprazole 40 mg daily as needed-typically it is triggered by certain foods that he eats

## 2023-08-01 NOTE — Addendum Note (Signed)
 Addended by: CLAUDENE TOBIAS PARAS on: 08/01/2023 07:56 AM   Modules accepted: Orders

## 2023-08-22 DIAGNOSIS — G4733 Obstructive sleep apnea (adult) (pediatric): Secondary | ICD-10-CM | POA: Diagnosis not present

## 2023-08-24 ENCOUNTER — Ambulatory Visit: Payer: BC Managed Care – PPO | Admitting: Adult Health

## 2023-08-29 ENCOUNTER — Other Ambulatory Visit (HOSPITAL_COMMUNITY): Payer: Self-pay

## 2023-08-31 ENCOUNTER — Ambulatory Visit (INDEPENDENT_AMBULATORY_CARE_PROVIDER_SITE_OTHER): Payer: BC Managed Care – PPO | Admitting: Adult Health

## 2023-08-31 ENCOUNTER — Encounter: Payer: Self-pay | Admitting: Adult Health

## 2023-08-31 VITALS — BP 102/67 | HR 67 | Ht 59.0 in | Wt 185.6 lb

## 2023-08-31 DIAGNOSIS — G4733 Obstructive sleep apnea (adult) (pediatric): Secondary | ICD-10-CM

## 2023-08-31 NOTE — Progress Notes (Signed)
 PATIENT: Rodney Johnston DOB: [birth year]  REASON FOR VISIT: follow up HISTORY FROM: patient PRIMARY NEUROLOGIST: Dr. Buck  Chief Complaint  Patient presents with   Follow-up    Rm 20, mom.  Doing ok, working on keeping it on.       HISTORY OF PRESENT ILLNESS: Today 08/31/23:  Rodney Johnston is a 23 y.o. male with a history of obstructive sleep apnea on CPAP. Returns today for follow-up.  He is here today with his mother.  She states that he has been on a better routine this summer.  His download indicates that he uses machine 28 out of 30 days for compliance of 93%.  He uses machine greater than 4 hours each night.  His average usage is 9-1/2 hours.  His residual AHI is 2.2 on 5 to 10 cm of water with EPR 2.  Leakage with the mask is minimal.  Returns today for an evaluation.     08/18/22: Rodney Johnston is a 23 y.o. male with a history of OSA on CPAP. Returns today for follow-up.  His download indicates that he used the machine 18 out of 30 days for compliance of 60%.  On average he uses the machine 7 hours.  His residual AHI is 2 on 5 to 10 cm of water with an EPR of 2.  Overall he reports that the CPAP does make him feel better.  His mother reports that his teachers at college can tell a difference when he does not use his machine.  She states sometimes he will watch his iPad in bed and fall asleep before putting it on.  I encouraged him to set an alarm.     HISTORY 08/26/2021: I reviewed his AutoPap compliance data from the past month from 07/27/2021 through 08/25/2021, which is a total of 30 days, during which time he used his machine 24 days with percent use days greater than 4 hours at 73%, indicating adequate compliance, average usage right at 6 hours, residual AHI at goal at 2.8/h, average pressure for the 95th percentile at 6.6 cm with a range of 5-10, leak on the low side with the 95th percentile at 0.9 L/min.  He reports doing fairly well, history is primarily  provided by his mom who reports that she sees a big difference in his sleepiness and alertness when he has used his AutoPap.  While in college at the dorm, he was not as consistent all the time, he would fall asleep without it on.  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  He is up-to-date with his supplies, uses a nasal mask but has had some chafing under his nose on the upper lip.  She has them use a Chapstick on the flaky area, he also has mild redness from the nasal mask alongside his nose.  She reports that he was much more noticeable when he came back from college.  It is possible that he is tightening the nasal mask which also may explain very little leak from the mass.  They are encouraged to go away with his mask to the DME provider to make sure he is not tightening it too much in the fit and size are right.  He is cannot his job in August.  He is applying for another program for another 2 years at [employer], hopefully.  He recently visited his grandparents in [place] .  His Epworth sleepiness score is 13 out of 24 but his mom  believes that this reflects mostly symptoms from when he does not actually use his AutoPap consistently.   The patient's allergies, current medications, family history, past medical history, past social history, past surgical history and problem list were reviewed and updated as appropriate.   REVIEW OF SYSTEMS: Out of a complete 14 system review of symptoms, the patient complains only of the following symptoms, and all other reviewed systems are negative.  FSS ESS  ALLERGIES: Allergies  Allergen Reactions   Gluten Meal Other (See Comments)    Other Reaction: GI Upset-Celiac disease Other Reaction: GI Upset-Celiac disease    Wheat Other (See Comments)    CELIAC DISEASE    HOME MEDICATIONS: Outpatient Medications Prior to Visit  Medication Sig Dispense Refill   cetirizine (ZYRTEC) 10 MG tablet Take 10 mg by mouth as needed for allergies (during  SPRING months ONLY).     Cholecalciferol (VITAMIN D-1000 MAX ST) 25 MCG (1000 UT) tablet Take 1,000 Units by mouth in the morning.     fluticasone (CUTIVATE) 0.05 % cream Apply topically 2 (two) times daily.     fluticasone (FLONASE) 50 MCG/ACT nasal spray Place 1 spray into the nose daily as needed for allergies (during SPRING months ONLY).     hydrocortisone valerate cream (WESTCORT) 0.2 % Apply 1 Application topically 2 (two) times daily.     hydrocortisone valerate cream (WESTCORT) 0.2 % Apply 1 Application topically 2 (two) times daily.     ibuprofen (ADVIL) 200 MG tablet Take 400 mg by mouth every 8 (eight) hours as needed (for pain.).     ipratropium (ATROVENT) 0.06 % nasal spray Place into both nostrils.     ketoconazole (NIZORAL) 2 % cream Apply 1 Application topically daily.     levothyroxine  (SYNTHROID ) 25 MCG tablet Take 1 tablet (25 mcg total) by mouth See admin instructions. 1.5 tabs 3 days a week and 1 tab rest of the week 110 tablet 3   Lisdexamfetamine Dimesylate 40 MG CHEW Chew by mouth.     mupirocin cream (BACTROBAN) 2 % Apply 1 Application topically 2 (two) times daily.     mupirocin ointment (BACTROBAN) 2 % 1 Application.     nystatin powder Apply 1 Application topically 3 (three) times daily.     pantoprazole  (PROTONIX ) 40 MG tablet Take 1 tablet 30 minutes before lunch (Patient taking differently: as needed. Take 1 tablet 30 minutes before lunch) 30 tablet 2   polyethylene glycol (MIRALAX / GLYCOLAX) packet Take 8.5 g by mouth in the morning.     tirzepatide  (ZEPBOUND ) 5 MG/0.5ML Pen Inject 5 mg into the skin once a week. 6 mL 3   No facility-administered medications prior to visit.    PAST MEDICAL HISTORY: Past Medical History:  Diagnosis Date   Anesthesia complication    mother states difficulty maintaining airway under conscious sedation previously; was told to never undergo a procedure without artificial airway maintenance   Celiac disease    Complication of  anesthesia 2009   mother states difficulty maintaining airway under conscious sedation previously; was told to never undergo a procedure without artificial airway maintenance, trouble during EGD at [facility]   Down syndrome    c-spine films done 09/13/2012: no atlantoaxial instability   Family history of adverse reaction to anesthesia    mother and pt brother PONV   History of asthma    no treatment since 2010   Hypothyroid    Impacted teeth with abnormal position 10/02/2013   wisdom teeth  Loose joints    due to Down syndrome   Low muscle tone    Rash, skin 10/11/2013   multiple sites   Seasonal allergies    Sensitive skin    Sleep apnea    per mother severe, uses CPAP as much as he can tolerate   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   Vitamin D deficiency     PAST SURGICAL HISTORY: Past Surgical History:  Procedure Laterality Date   BALLOON DILATION N/A 10/20/2021   Procedure: BALLOON DILATION;  Surgeon: Stacia Glendia BRAVO, MD;  Location: [facility];  Service: Gastroenterology;  Laterality: N/A;   BIOPSY  10/20/2021   Procedure: BIOPSY;  Surgeon: Stacia Glendia BRAVO, MD;  Location: [facility];  Service: Gastroenterology;;   DUODENAL ATRESIA REPAIR  1999-02-10   ESOPHAGOGASTRODUODENOSCOPY (EGD) WITH PROPOFOL  N/A 10/20/2021   Procedure: ESOPHAGOGASTRODUODENOSCOPY (EGD) WITH PROPOFOL ;  Surgeon: Stacia Glendia BRAVO, MD;  Location: [facility];  Service: Gastroenterology;  Laterality: N/A;   EXPLORATORY LAPAROTOMY  03-03-1999   HEMANGIOMA EXCISION  2012   from eye   MASTOIDECTOMY     MULTIPLE TOOTH EXTRACTIONS  10/02/2009   #R,Q,N,M   STRABISMUS SURGERY  2010, 2011   TEAR DUCT PROBING  2002   TONSILLECTOMY AND ADENOIDECTOMY  02/23/2001   TOOTH EXTRACTION  09/23/2011   Procedure: EXTRACTION MOLARS;  Surgeon: Fairy LITTIE Pinal, DDS;  Location: [facility];  Service: Oral Surgery;  Laterality: N/A;  surgical removal of teeth A,B,C,H,J,K,T   TOOTH EXTRACTION N/A  10/17/2013   Procedure: EXTRACTION TEETH #17, 21, 28, 32, REMOVAL OF ODONTOGENIC TUMOR LEFT MANDIBLE, EXPOSE TOOTH #15 AND LABIAL FRENECTOMY    ;  Surgeon: Fairy LITTIE Pinal, DDS;  Location: [facility];  Service: Oral Surgery;  Laterality: N/A;   TYMPANOSTOMY TUBE CHANGE W/ MLB  10/02/2009; 02/23/2001   revision right ear myringotomy; remove and replace left ear tube   TYMPANOSTOMY TUBE PLACEMENT  04/18/2000   UPPER GI ENDOSCOPY  2007    FAMILY HISTORY: Family History  Problem Relation Age of Onset   Irritable bowel syndrome Mother    Hearing loss Brother    Sleep apnea Maternal Aunt    Sleep apnea Maternal Uncle    Diabetes Maternal Grandmother    Hypertension Maternal Grandmother    Arthritis Maternal Grandmother    Alcohol abuse Maternal Grandfather    Asthma Paternal Grandfather    Colon cancer Neg Hx    Esophageal cancer Neg Hx    Rectal cancer Neg Hx    Stomach cancer Neg Hx     SOCIAL HISTORY: Social History   Socioeconomic History   Marital status: Single    Spouse name: Not on file   Number of children: Not on file   Years of education: Not on file   Highest education level: Associate degree: occupational, Scientist, product/process development, or vocational program  Occupational History   Not on file  Tobacco Use   Smoking status: Never   Smokeless tobacco: Never  Substance and Sexual Activity   Alcohol use: No    Comment: special occasion   Drug use: No   Sexual activity: Not on file  Other Topics Concern   Not on file  Social History Narrative   Not on file   Social Drivers of Health   Financial Resource Strain: Low Risk  (07/28/2023)   Overall Financial Resource Strain (CARDIA)    Difficulty of Paying Living Expenses: Not hard at all  Food Insecurity: No Food  Insecurity (07/28/2023)   Hunger Vital Sign    Worried About Running Out of Food in the Last Year: Never true    Ran Out of Food in the Last Year: Never true  Transportation Needs: No Transportation Needs  (07/28/2023)   PRAPARE - Administrator, Civil Service (Medical): No    Lack of Transportation (Non-Medical): No  Physical Activity: Sufficiently Active (07/28/2023)   Exercise Vital Sign    Days of Exercise per Week: 5 days    Minutes of Exercise per Session: 30 min  Stress: No Stress Concern Present (07/28/2023)   [employer] of Occupational Health - Occupational Stress Questionnaire    Feeling of Stress: Not at all  Social Connections: Moderately Isolated (07/28/2023)   Social Connection and Isolation Panel    Frequency of Communication with Friends and Family: More than three times a week    Frequency of Social Gatherings with Friends and Family: More than three times a week    Attends Religious Services: Never    Database administrator or Organizations: Yes    Attends Engineer, structural: More than 4 times per year    Marital Status: Never married  Intimate Partner Violence: Not on file      PHYSICAL EXAM  Vitals:   08/31/23 0838  BP: 102/67  Pulse: 67  Weight: 185 lb 9.6 oz (84.2 kg)  Height: 4' 11 (1.499 m)   Body mass index is 37.49 kg/m.  Generalized: Well developed, in no acute distress  Chest: Lungs clear to auscultation bilaterally  Neurological examination  Mentation: Alert oriented to time, place, history taking. Follows all commands speech and language fluent Cranial nerve II-XII: Facial symmetry noted   DIAGNOSTIC DATA (LABS, IMAGING, TESTING) - I reviewed patient records, labs, notes, testing and imaging myself where available.  Lab Results  Component Value Date   WBC 9.4 01/16/2009   HGB 12.0 01/16/2009   HCT 33.7 01/16/2009   MCV 98.4 (H) 01/16/2009   PLT 363 01/16/2009      Component Value Date/Time   NA 140 01/16/2009 2158   K 4.4 01/16/2009 2158   CL 100 01/16/2009 2158   CO2 31 01/16/2009 2158   GLUCOSE 102 (H) 01/16/2009 2158   BUN 10 01/16/2009 2158   CREATININE 0.70 01/16/2009 2158   CALCIUM 8.4  01/16/2009 2158   PROT 6.4 01/16/2009 2158   ALBUMIN 3.1 (L) 01/16/2009 2158   AST 25 01/16/2009 2158   ALT 18 01/16/2009 2158   ALKPHOS 127 01/16/2009 2158   BILITOT 0.6 01/16/2009 2158       ASSESSMENT AND PLAN 23 y.o. year old male  has a past medical history of Anesthesia complication, Celiac disease, Complication of anesthesia (2009), Down syndrome, Family history of adverse reaction to anesthesia, History of asthma, Hypothyroid, Impacted teeth with abnormal position (10/02/2013), Loose joints, Low muscle tone, Rash, skin (10/11/2013), Seasonal allergies, Sensitive skin, Sleep apnea, Tumor of jaw (10/02/2013), and Vitamin D deficiency. here with:  OSA on CPAP  - CPAP compliance good - Good treatment of AHI  - Encourage patient to use CPAP nightly and > 4 hours each night - F/U in 1 year or sooner if needed    Duwaine Russell, MSN, NP-C 08/31/2023, 8:44 AM [facility] [address] [place], [place] [address] [phone]  The patient's condition requires frequent monitoring and adjustments in the treatment plan, reflecting the ongoing complexity of care.  This provider is the continuing focal  point for all needed services for this condition.

## 2023-08-31 NOTE — Patient Instructions (Signed)
 Continue using CPAP nightly and greater than 4 hours each night If your symptoms worsen or you develop new symptoms please let us know.

## 2023-08-31 NOTE — Progress Notes (Signed)
 SABRA

## 2023-09-05 ENCOUNTER — Other Ambulatory Visit (HOSPITAL_COMMUNITY): Payer: Self-pay

## 2023-10-06 DIAGNOSIS — G4733 Obstructive sleep apnea (adult) (pediatric): Secondary | ICD-10-CM | POA: Diagnosis not present

## 2023-11-08 ENCOUNTER — Encounter: Payer: Self-pay | Admitting: Internal Medicine

## 2023-11-09 ENCOUNTER — Telehealth: Payer: Self-pay

## 2023-11-09 MED ORDER — TIRZEPATIDE 7.5 MG/0.5ML ~~LOC~~ SOAJ: 7.5000 mg | SUBCUTANEOUS | 3 refills | Status: DC

## 2023-11-09 NOTE — Telephone Encounter (Signed)
 Pharmacy Patient Advocate Encounter   Received notification from CoverMyMeds that prior authorization for Mounjaro  7.5MG /0.5ML auto-injectors is required/requested.   Insurance verification completed.   The patient is insured through [employer].   Patient does not have a dx of type 2 diabetes and therefore, will not be approved for Mounjaro . Patient has previously been taking zepbound . Please advise

## 2023-11-10 MED ORDER — TIRZEPATIDE-WEIGHT MANAGEMENT 7.5 MG/0.5ML ~~LOC~~ SOAJ: 7.5000 mg | SUBCUTANEOUS | 3 refills | Status: AC

## 2023-11-10 NOTE — Addendum Note (Signed)
 Addended by: SAM DONELL PARAS on: 11/10/2023 07:46 AM   Modules accepted: Orders

## 2023-12-06 DIAGNOSIS — G4733 Obstructive sleep apnea (adult) (pediatric): Secondary | ICD-10-CM | POA: Diagnosis not present

## 2024-01-10 ENCOUNTER — Encounter: Payer: Self-pay | Admitting: Internal Medicine

## 2024-01-10 ENCOUNTER — Ambulatory Visit: Admitting: Internal Medicine

## 2024-01-10 ENCOUNTER — Other Ambulatory Visit

## 2024-01-10 VITALS — BP 130/78 | HR 86 | Ht 59.0 in | Wt 187.0 lb

## 2024-01-10 DIAGNOSIS — E559 Vitamin D deficiency, unspecified: Secondary | ICD-10-CM | POA: Diagnosis not present

## 2024-01-10 DIAGNOSIS — R7303 Prediabetes: Secondary | ICD-10-CM | POA: Diagnosis not present

## 2024-01-10 DIAGNOSIS — E039 Hypothyroidism, unspecified: Secondary | ICD-10-CM

## 2024-01-10 DIAGNOSIS — F50819 Binge eating disorder, unspecified: Secondary | ICD-10-CM | POA: Diagnosis not present

## 2024-01-10 NOTE — Progress Notes (Unsigned)
 Name: Rodney Johnston  MRN/ DOB: [id number], [birth year]    Age/ Sex: 23 y.o., male    PCP: Geofm Glade PARAS, MD   Reason for Endocrinology Evaluation: Hypothyroidism     Date of Initial Endocrinology Evaluation: 01/10/2024     HPI: [name] is a 23 y.o. male with a past medical history of prediabetes, vitamin D deficiency, hypothyroid and hyperphagia, celiac disease, Down syndrome, OSA. The patient presented for initial endocrinology clinic visit on 01/10/2024 for consultative assistance with his Hypothyroidism.    Patient was following up with [facility] for vitamin D deficiency, prediabetes, hypothyroid, hyperphagia, and celiac disease   THYROID  HISTORY: Patient was evaluated by peds endocrinology in 2009, he was started on levothyroxine  Anti-TPO antibodies negative  No Fh of thyroid  disease   PREDIABETES: His A1c was 5.8% in 01/2020 Diabetes autoantibodies have been negative through [facility] peds endocrinology, including gad 65 antibodies, insulin  antibodies, IA-2 antibodies, ZNT8 antibodies     HYPERPHAGIA: Patient has been on long-term Vyvanse through peds endocrinology for hyperphagia/binge eating disorder. He was eventually switched from Vyvanse to Wegovy  due to lack of clinical response to Vyvanse   Dexamethasone  suppression test was normal with a cortisol level of 0.8 mcg/DL in May, 7974  SUBJECTIVE:    Today (01/10/24):  Rodney Johnston is here for follow-up on hypothyroid, prediabetes, and hyperphagia   He continues to follow-up with neurology for OSA,  He is accompanied by his mother ( Rodney Johnston)  Patient has been noted with weight loss since his last visit here Has occasional  stomach aches and headaches  Continues to use Miralax daily for constipation  No local neck swelling  No palpitations  No tremors   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[employer] , hospitality certificate    MEDICATIONS Levothyroxine  37.5 mcg 3 days a week  +25 mcg 4 days a week Vitamin D3 1000 units daily Zepbount 7.5 weekly  Gluten-free diet   HISTORY:  Past Medical History:  Past Medical History:  Diagnosis Date   Anesthesia complication    mother states difficulty maintaining airway under conscious sedation previously; was told to never undergo a procedure without artificial airway maintenance   Celiac disease    Complication of anesthesia 2009   mother states difficulty maintaining airway under conscious sedation previously; was told to never undergo a procedure without artificial airway maintenance, trouble during EGD at [facility]   Down syndrome    c-spine films done 09/13/2012: no atlantoaxial instability   Family history of adverse reaction to anesthesia    mother and pt brother PONV   History of asthma    no treatment since 2010   Hypothyroid    Impacted teeth with abnormal position 10/02/2013   wisdom teeth   Loose joints    due to Down syndrome   Low muscle tone    Rash, skin 10/11/2013   multiple sites   Seasonal allergies    Sensitive skin    Sleep apnea    per mother severe, uses CPAP as much as he can tolerate   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   Vitamin D deficiency    Past Surgical History:  Past Surgical History:  Procedure Laterality Date   BALLOON DILATION N/A 10/20/2021   Procedure: BALLOON DILATION;  Surgeon: Stacia Glendia BRAVO, MD;  Location: [facility];  Service: Gastroenterology;  Laterality: N/A;   BIOPSY  10/20/2021   Procedure: BIOPSY;  Surgeon: Stacia Glendia BRAVO, MD;  Location: [facility];  Service: Gastroenterology;;   DUODENAL ATRESIA REPAIR  25-Jul-1999   ESOPHAGOGASTRODUODENOSCOPY (EGD) WITH PROPOFOL  N/A 10/20/2021   Procedure: ESOPHAGOGASTRODUODENOSCOPY (EGD) WITH PROPOFOL ;  Surgeon: Stacia Glendia BRAVO, MD;  Location: [facility];  Service: Gastroenterology;  Laterality: N/A;   EXPLORATORY LAPAROTOMY  03-16-99   HEMANGIOMA EXCISION  2012   from eye   MASTOIDECTOMY      MULTIPLE TOOTH EXTRACTIONS  10/02/2009   #R,Q,N,M   STRABISMUS SURGERY  2010, 2011   TEAR DUCT PROBING  2002   TONSILLECTOMY AND ADENOIDECTOMY  02/23/2001   TOOTH EXTRACTION  09/23/2011   Procedure: EXTRACTION MOLARS;  Surgeon: Fairy LITTIE Pinal, DDS;  Location: [facility];  Service: Oral Surgery;  Laterality: N/A;  surgical removal of teeth A,B,C,H,J,K,T   TOOTH EXTRACTION N/A 10/17/2013   Procedure: EXTRACTION TEETH #17, 21, 28, 32, REMOVAL OF ODONTOGENIC TUMOR LEFT MANDIBLE, EXPOSE TOOTH #15 AND LABIAL FRENECTOMY    ;  Surgeon: Fairy LITTIE Pinal, DDS;  Location:  SUR[facility]ERY CENTER;  Service: Oral Surgery;  Laterality: N/A;   TYMPANOSTOMY TUBE CHANGE W/ MLB  10/02/2009; 02/23/2001   revision right ear myringotomy; remove and replace left ear tube   TYMPANOSTOMY TUBE PLACEMENT  04/18/2000   UPPER GI ENDOSCOPY  2007    Social History:  reports that he has never smoked. He has never used smokeless tobacco. He reports that he does not drink alcohol and does not use drugs. Family History: family history includes Alcohol abuse in his maternal grandfather; Arthritis in his maternal grandmother; Asthma in his paternal grandfather; Diabetes in his maternal grandmother; Hearing loss in his brother; Hypertension in his maternal grandmother; Irritable bowel syndrome in his mother; Sleep apnea in his maternal aunt and maternal uncle.   HOME MEDICATIONS: Allergies as of 01/10/2024       Reactions   Gluten Meal Other (See Comments)   Other Reaction: GI Upset-Celiac disease Other Reaction: GI Upset-Celiac disease   Wheat Other (See Comments)   CELIAC DISEASE        Medication List        Accurate as of January 10, 2024  1:13 PM. If you have any questions, ask your nurse or doctor.          cetirizine 10 MG tablet Commonly known as: ZYRTEC Take 10 mg by mouth as needed for allergies (during SPRING months ONLY).   fluticasone 0.05 % cream Commonly known as: CUTIVATE Apply  topically 2 (two) times daily.   fluticasone 50 MCG/ACT nasal spray Commonly known as: FLONASE Place 1 spray into the nose daily as needed for allergies (during SPRING months ONLY).   hydrocortisone valerate cream 0.2 % Commonly known as: WESTCORT Apply 1 Application topically 2 (two) times daily.   hydrocortisone valerate cream 0.2 % Commonly known as: WESTCORT Apply 1 Application topically 2 (two) times daily.   ibuprofen 200 MG tablet Commonly known as: ADVIL Take 400 mg by mouth every 8 (eight) hours as needed (for pain.).   ipratropium 0.06 % nasal spray Commonly known as: ATROVENT Place into both nostrils.   ketoconazole 2 % cream Commonly known as: NIZORAL Apply 1 Application topically daily.   levothyroxine  25 MCG tablet Commonly known as: SYNTHROID  Take 1 tablet (25 mcg total) by mouth See admin instructions. 1.5 tabs 3 days a week and 1 tab rest of the week   Lisdexamfetamine Dimesylate 40 MG Chew Chew by mouth.   mupirocin cream 2 % Commonly known as: BACTROBAN Apply 1  Application topically 2 (two) times daily.   mupirocin ointment 2 % Commonly known as: BACTROBAN 1 Application.   nystatin powder Apply 1 Application topically 3 (three) times daily.   pantoprazole  40 MG tablet Commonly known as: PROTONIX  Take 1 tablet 30 minutes before lunch What changed:  when to take this reasons to take this   polyethylene glycol 17 g packet Commonly known as: MIRALAX / GLYCOLAX Take 8.5 g by mouth in the morning.   tirzepatide  7.5 MG/0.5ML Pen Commonly known as: ZEPBOUND  Inject 7.5 mg into the skin once a week.   Vitamin D-1000 Max St 25 MCG (1000 UT) tablet Generic drug: Cholecalciferol Take 1,000 Units by mouth in the morning.          REVIEW OF SYSTEMS: A comprehensive ROS was conducted with the patient and is negative except as per HPI    OBJECTIVE:  VS: BP 130/78   Pulse 86   Ht 4' 11 (1.499 m)   Wt 187 lb (84.8 kg)   SpO2 97%   BMI  37.77 kg/m    Wt Readings from Last 3 Encounters:  01/10/24 187 lb (84.8 kg)  08/31/23 185 lb 9.6 oz (84.2 kg)  07/29/23 189 lb (85.7 kg)     EXAM: General: Pt appears well and is in NAD  Neck: General: Supple without adenopathy. Thyroid :  No goiter or nodules appreciated.  Lungs: Clear with good BS bilat   Heart: Auscultation: RRR.  Abdomen: Soft, nontender  Extremities:  BL LE: No pretibial edema   Mental Status: Judgment, insight: Intact Orientation: Oriented to time, place, and person Mood and affect: No depression, anxiety, or agitation     DATA REVIEWED: ****  Latest Ref Rng 01/13/2022  Diabetes Interpretation, S See Comments  GAD65 Ab Assay, S (BKRREF) <=0.02 nmol/L 0.00  Insulin  Antibodies 0.00 - 0.02 nmol/L 0.00  IA-2 Ab, S <=0.02 nmol/L 0.00  ZnT8 Ab, S <15.0 U/mL <15.0    ASSESSMENT/PLAN/RECOMMENDATIONS:   Hypothyroidism   - Patient is clinically euthyroid -No local neck symptoms - TFTs today****  Medications : Levothyroxine  25 mcg, 1.5 tablets 3 days a week and 1 tablet rest of the week  2. Pre-diabetes :  - A1c    3. Obesity/Bing eating disorder :  -Vyvanse was ineffective - We switch from Wegovy , due to lack of weight loss -Currently on Zepbound , tolerating well   Medication Zepbound  7.5 mg weekly  Follow-up in 6 months  Signed electronically by: Stefano Redgie Butts, MD  [facility] End[facility]  [facility] [address] [place] [place], [place] [address] Phone: [phone] FAX: [phone]   CC: Geofm Glade PARAS, MD [address] [place] [address] Phone: [phone] Fax: [phone]   Return to Endocrinology clinic as below: Future Appointments  Date Time Provider Department Center  08/09/2024 10:00 AM Geofm Glade PARAS, MD [id number]  09/03/2024  1:30 PM Sherryl Bouchard, NP [id number] None

## 2024-01-11 ENCOUNTER — Ambulatory Visit: Payer: Self-pay | Admitting: Internal Medicine

## 2024-01-11 LAB — HEMOGLOBIN A1C
Hgb A1c MFr Bld: 5.5 % (ref <5.7)
Mean Plasma Glucose: 111 mg/dL
eAG (mmol/L): 6.2 mmol/L

## 2024-01-11 LAB — TSH: TSH: 2.52 m[IU]/L (ref 0.40–4.50)

## 2024-01-11 LAB — VITAMIN D 25 HYDROXY (VIT D DEFICIENCY, FRACTURES): Vit D, 25-Hydroxy: 44 ng/mL (ref 30–100)

## 2024-01-11 LAB — T4, FREE: Free T4: 1.5 ng/dL (ref 0.8–1.8)

## 2024-01-11 MED ORDER — LEVOTHYROXINE SODIUM 25 MCG PO TABS: 25.0000 ug | ORAL_TABLET | ORAL | 3 refills | Status: AC

## 2024-01-18 DIAGNOSIS — H5501 Congenital nystagmus: Secondary | ICD-10-CM | POA: Diagnosis not present

## 2024-01-18 DIAGNOSIS — H5021 Vertical strabismus, right eye: Secondary | ICD-10-CM | POA: Diagnosis not present

## 2024-01-18 DIAGNOSIS — H52223 Regular astigmatism, bilateral: Secondary | ICD-10-CM | POA: Diagnosis not present

## 2024-01-18 DIAGNOSIS — Q909 Down syndrome, unspecified: Secondary | ICD-10-CM | POA: Diagnosis not present

## 2024-01-20 DIAGNOSIS — G4733 Obstructive sleep apnea (adult) (pediatric): Secondary | ICD-10-CM | POA: Diagnosis not present

## 2024-02-21 ENCOUNTER — Encounter: Payer: Self-pay | Admitting: Internal Medicine

## 2024-07-19 ENCOUNTER — Ambulatory Visit: Admitting: Internal Medicine

## 2024-08-09 ENCOUNTER — Encounter: Admitting: Internal Medicine

## 2024-08-22 ENCOUNTER — Encounter: Admitting: Internal Medicine

## 2024-09-03 ENCOUNTER — Ambulatory Visit: Admitting: Adult Health
